# Patient Record
Sex: Female | Born: 1975 | Race: White | Hispanic: No | Marital: Married | Smoking: Current every day smoker
Health system: Southern US, Community
[De-identification: ages and names within clinical notes are randomized; demographics above are authoritative.]

## PROBLEM LIST (undated history)

## (undated) DIAGNOSIS — F329 Major depressive disorder, single episode, unspecified: Secondary | ICD-10-CM

## (undated) DIAGNOSIS — I10 Essential (primary) hypertension: Secondary | ICD-10-CM

## (undated) DIAGNOSIS — F32A Depression, unspecified: Secondary | ICD-10-CM

## (undated) HISTORY — DX: Major depressive disorder, single episode, unspecified: F32.9

## (undated) HISTORY — PX: DILATION AND CURETTAGE OF UTERUS: SHX78

## (undated) HISTORY — DX: Depression, unspecified: F32.A

## (undated) HISTORY — DX: Essential (primary) hypertension: I10

## (undated) HISTORY — PX: TUBAL LIGATION: SHX77

## (undated) HISTORY — PX: OTHER SURGICAL HISTORY: SHX169

---

## 2001-09-01 ENCOUNTER — Other Ambulatory Visit: Admission: RE | Admit: 2001-09-01 | Discharge: 2001-09-01 | Payer: Self-pay | Admitting: Obstetrics and Gynecology

## 2002-04-22 ENCOUNTER — Inpatient Hospital Stay (HOSPITAL_COMMUNITY): Admission: AD | Admit: 2002-04-22 | Discharge: 2002-04-25 | Payer: Self-pay | Admitting: Obstetrics and Gynecology

## 2002-05-17 ENCOUNTER — Emergency Department (HOSPITAL_COMMUNITY): Admission: EM | Admit: 2002-05-17 | Discharge: 2002-05-17 | Payer: Self-pay | Admitting: Emergency Medicine

## 2002-05-17 ENCOUNTER — Encounter: Payer: Self-pay | Admitting: Emergency Medicine

## 2002-10-29 ENCOUNTER — Other Ambulatory Visit: Admission: RE | Admit: 2002-10-29 | Discharge: 2002-10-29 | Payer: Self-pay | Admitting: Obstetrics and Gynecology

## 2004-10-22 ENCOUNTER — Other Ambulatory Visit: Admission: RE | Admit: 2004-10-22 | Discharge: 2004-10-22 | Payer: Self-pay | Admitting: Obstetrics and Gynecology

## 2006-01-24 ENCOUNTER — Inpatient Hospital Stay (HOSPITAL_COMMUNITY): Admission: RE | Admit: 2006-01-24 | Discharge: 2006-01-26 | Payer: Self-pay | Admitting: Obstetrics and Gynecology

## 2006-01-24 ENCOUNTER — Encounter (INDEPENDENT_AMBULATORY_CARE_PROVIDER_SITE_OTHER): Payer: Self-pay | Admitting: Specialist

## 2006-01-29 ENCOUNTER — Observation Stay (HOSPITAL_COMMUNITY): Admission: AD | Admit: 2006-01-29 | Discharge: 2006-01-30 | Payer: Self-pay | Admitting: Obstetrics and Gynecology

## 2006-02-07 ENCOUNTER — Inpatient Hospital Stay (HOSPITAL_COMMUNITY): Admission: AD | Admit: 2006-02-07 | Discharge: 2006-02-08 | Payer: Self-pay | Admitting: Obstetrics and Gynecology

## 2006-02-07 ENCOUNTER — Encounter (INDEPENDENT_AMBULATORY_CARE_PROVIDER_SITE_OTHER): Payer: Self-pay | Admitting: Specialist

## 2008-06-09 ENCOUNTER — Ambulatory Visit: Payer: Self-pay | Admitting: Diagnostic Radiology

## 2008-06-09 ENCOUNTER — Emergency Department (HOSPITAL_BASED_OUTPATIENT_CLINIC_OR_DEPARTMENT_OTHER): Admission: EM | Admit: 2008-06-09 | Discharge: 2008-06-09 | Payer: Self-pay | Admitting: Emergency Medicine

## 2008-09-13 ENCOUNTER — Encounter: Payer: Self-pay | Admitting: Family Medicine

## 2008-09-13 ENCOUNTER — Encounter (INDEPENDENT_AMBULATORY_CARE_PROVIDER_SITE_OTHER): Payer: Self-pay | Admitting: *Deleted

## 2008-09-13 LAB — CONVERTED CEMR LAB
ALT: 18 units/L
AST: 19 units/L
Albumin: 4.4 g/dL
Alkaline Phosphatase: 79 units/L
BUN: 15 mg/dL
CO2, serum: 25 mmol/L
Calcium: 9.6 mg/dL
Chloride, Serum: 103 mmol/L
Creatinine, Ser: 0.93 mg/dL
Glucose, Bld: 77 mg/dL
HCT: 38.2 %
Hemoglobin: 13.1 g/dL
MCH: 34.3 pg
MCV: 86.6 fL
Platelets: 220 10*3/uL
Potassium, serum: 3.9 mmol/L
RBC count: 4.41 10*6/uL
Sodium, serum: 141 mmol/L
Total Bilirubin: 0.5 mg/dL
Total Protein: 6.7 g/dL
WBC, blood: 7.7 10*3/uL

## 2008-09-16 ENCOUNTER — Encounter: Admission: RE | Admit: 2008-09-16 | Discharge: 2008-09-16 | Payer: Self-pay | Admitting: Obstetrics and Gynecology

## 2008-09-16 ENCOUNTER — Encounter: Payer: Self-pay | Admitting: Family Medicine

## 2008-09-20 ENCOUNTER — Encounter: Payer: Self-pay | Admitting: Family Medicine

## 2008-09-21 ENCOUNTER — Ambulatory Visit: Payer: Self-pay | Admitting: Family Medicine

## 2008-09-21 DIAGNOSIS — F329 Major depressive disorder, single episode, unspecified: Secondary | ICD-10-CM

## 2008-09-21 DIAGNOSIS — I10 Essential (primary) hypertension: Secondary | ICD-10-CM

## 2008-09-21 DIAGNOSIS — F419 Anxiety disorder, unspecified: Secondary | ICD-10-CM | POA: Insufficient documentation

## 2008-09-21 DIAGNOSIS — F17200 Nicotine dependence, unspecified, uncomplicated: Secondary | ICD-10-CM

## 2008-09-23 ENCOUNTER — Encounter (INDEPENDENT_AMBULATORY_CARE_PROVIDER_SITE_OTHER): Payer: Self-pay | Admitting: *Deleted

## 2008-10-21 ENCOUNTER — Ambulatory Visit: Payer: Self-pay | Admitting: Family Medicine

## 2008-10-21 DIAGNOSIS — J309 Allergic rhinitis, unspecified: Secondary | ICD-10-CM | POA: Insufficient documentation

## 2008-10-24 ENCOUNTER — Encounter (INDEPENDENT_AMBULATORY_CARE_PROVIDER_SITE_OTHER): Payer: Self-pay | Admitting: *Deleted

## 2008-10-24 LAB — CONVERTED CEMR LAB
ALT: 24 units/L (ref 0–35)
AST: 20 units/L (ref 0–37)
Albumin: 4.2 g/dL (ref 3.5–5.2)
Alkaline Phosphatase: 82 units/L (ref 39–117)
BUN: 19 mg/dL (ref 6–23)
Basophils Absolute: 0 10*3/uL (ref 0.0–0.1)
Basophils Relative: 0.3 % (ref 0.0–3.0)
Bilirubin, Direct: 0.1 mg/dL (ref 0.0–0.3)
CO2: 27 meq/L (ref 19–32)
Calcium: 9.3 mg/dL (ref 8.4–10.5)
Chloride: 104 meq/L (ref 96–112)
Cholesterol: 192 mg/dL (ref 0–200)
Creatinine, Ser: 0.9 mg/dL (ref 0.4–1.2)
Eosinophils Absolute: 0.1 10*3/uL (ref 0.0–0.7)
Eosinophils Relative: 2.2 % (ref 0.0–5.0)
GFR calc non Af Amer: 77.24 mL/min (ref >60)
Glucose, Bld: 81 mg/dL (ref 70–99)
HCT: 40.3 % (ref 36.0–46.0)
HDL: 56.3 mg/dL (ref >39.00)
Hemoglobin: 14.1 g/dL (ref 12.0–15.0)
LDL Cholesterol: 120 mg/dL — ABNORMAL HIGH (ref 0–99)
Lymphocytes Relative: 32.3 % (ref 12.0–46.0)
Lymphs Abs: 2 10*3/uL (ref 0.7–4.0)
MCHC: 35 g/dL (ref 30.0–36.0)
MCV: 87.6 fL (ref 78.0–100.0)
Monocytes Absolute: 0.6 10*3/uL (ref 0.1–1.0)
Monocytes Relative: 9.3 % (ref 3.0–12.0)
Neutro Abs: 3.6 10*3/uL (ref 1.4–7.7)
Neutrophils Relative %: 55.9 % (ref 43.0–77.0)
Platelets: 236 10*3/uL (ref 150.0–400.0)
Potassium: 4.3 meq/L (ref 3.5–5.1)
RBC: 4.6 M/uL (ref 3.87–5.11)
RDW: 11.5 % (ref 11.5–14.6)
Sodium: 139 meq/L (ref 135–145)
TSH: 0.71 microintl units/mL (ref 0.35–5.50)
Total Bilirubin: 0.6 mg/dL (ref 0.3–1.2)
Total CHOL/HDL Ratio: 3
Total Protein: 7.4 g/dL (ref 6.0–8.3)
Triglycerides: 81 mg/dL (ref 0.0–149.0)
VLDL: 16.2 mg/dL (ref 0.0–40.0)
WBC: 6.3 10*3/uL (ref 4.5–10.5)

## 2009-01-10 ENCOUNTER — Telehealth (INDEPENDENT_AMBULATORY_CARE_PROVIDER_SITE_OTHER): Payer: Self-pay | Admitting: *Deleted

## 2009-04-10 ENCOUNTER — Ambulatory Visit: Payer: Self-pay | Admitting: Family

## 2009-05-22 ENCOUNTER — Telehealth (INDEPENDENT_AMBULATORY_CARE_PROVIDER_SITE_OTHER): Payer: Self-pay | Admitting: *Deleted

## 2009-07-17 ENCOUNTER — Telehealth (INDEPENDENT_AMBULATORY_CARE_PROVIDER_SITE_OTHER): Payer: Self-pay | Admitting: *Deleted

## 2009-08-16 ENCOUNTER — Telehealth (INDEPENDENT_AMBULATORY_CARE_PROVIDER_SITE_OTHER): Payer: Self-pay | Admitting: *Deleted

## 2009-09-21 ENCOUNTER — Ambulatory Visit: Payer: Self-pay | Admitting: Family Medicine

## 2009-09-25 LAB — CONVERTED CEMR LAB
AST: 22 units/L (ref 0–37)
Albumin: 4.4 g/dL (ref 3.5–5.2)
Alkaline Phosphatase: 54 units/L (ref 39–117)
BUN: 10 mg/dL (ref 6–23)
Calcium: 9.5 mg/dL (ref 8.4–10.5)
Chloride: 106 meq/L (ref 96–112)
Cholesterol: 158 mg/dL (ref 0–200)
Creatinine, Ser: 0.8 mg/dL (ref 0.4–1.2)
Eosinophils Relative: 1.4 % (ref 0.0–5.0)
GFR calc non Af Amer: 87.98 mL/min (ref >60)
LDL Cholesterol: 91 mg/dL (ref 0–99)
Lymphocytes Relative: 31.8 % (ref 12.0–46.0)
MCV: 89.9 fL (ref 78.0–100.0)
Monocytes Absolute: 0.6 10*3/uL (ref 0.1–1.0)
Neutrophils Relative %: 58.4 % (ref 43.0–77.0)
Platelets: 205 10*3/uL (ref 150.0–400.0)
Total Protein: 7.1 g/dL (ref 6.0–8.3)
Triglycerides: 66 mg/dL (ref 0.0–149.0)
WBC: 7.5 10*3/uL (ref 4.5–10.5)

## 2009-10-03 ENCOUNTER — Telehealth (INDEPENDENT_AMBULATORY_CARE_PROVIDER_SITE_OTHER): Payer: Self-pay | Admitting: *Deleted

## 2009-10-05 ENCOUNTER — Ambulatory Visit: Payer: Self-pay | Admitting: Family Medicine

## 2009-10-05 DIAGNOSIS — L909 Atrophic disorder of skin, unspecified: Secondary | ICD-10-CM | POA: Insufficient documentation

## 2009-10-05 DIAGNOSIS — D239 Other benign neoplasm of skin, unspecified: Secondary | ICD-10-CM | POA: Insufficient documentation

## 2009-10-05 DIAGNOSIS — L919 Hypertrophic disorder of the skin, unspecified: Secondary | ICD-10-CM

## 2009-10-09 ENCOUNTER — Telehealth (INDEPENDENT_AMBULATORY_CARE_PROVIDER_SITE_OTHER): Payer: Self-pay | Admitting: *Deleted

## 2010-02-28 ENCOUNTER — Telehealth: Payer: Self-pay | Admitting: Family Medicine

## 2010-05-14 ENCOUNTER — Telehealth (INDEPENDENT_AMBULATORY_CARE_PROVIDER_SITE_OTHER): Payer: Self-pay | Admitting: *Deleted

## 2010-05-14 ENCOUNTER — Ambulatory Visit: Payer: Self-pay | Admitting: Family Medicine

## 2010-05-14 DIAGNOSIS — M25569 Pain in unspecified knee: Secondary | ICD-10-CM

## 2010-05-23 ENCOUNTER — Ambulatory Visit: Payer: Self-pay | Admitting: Family Medicine

## 2010-05-23 DIAGNOSIS — M2140 Flat foot [pes planus] (acquired), unspecified foot: Secondary | ICD-10-CM | POA: Insufficient documentation

## 2010-05-29 ENCOUNTER — Telehealth: Payer: Self-pay | Admitting: Family Medicine

## 2010-06-13 ENCOUNTER — Telehealth (INDEPENDENT_AMBULATORY_CARE_PROVIDER_SITE_OTHER): Payer: Self-pay | Admitting: *Deleted

## 2010-06-25 ENCOUNTER — Ambulatory Visit
Admission: RE | Admit: 2010-06-25 | Discharge: 2010-06-25 | Payer: Self-pay | Source: Home / Self Care | Attending: Family Medicine | Admitting: Family Medicine

## 2010-06-25 DIAGNOSIS — M7750 Other enthesopathy of unspecified foot: Secondary | ICD-10-CM | POA: Insufficient documentation

## 2010-07-03 NOTE — Progress Notes (Signed)
Summary: amoldipine and lisinopril  Phone Note Refill Request Message from:  [facility] [address] Fax #: [phone]  Refills Requested: Medication #1:  AMLODIPINE BESYLATE 10 MG  TABS once daily   Dosage confirmed as above?Dosage Confirmed   Brand Name Necessary? No   Supply Requested: 1 month   Last Refilled: 07/17/2009  Medication #2:  LISINOPRIL 10 MG  TABS Take 1 tab by mouth daily   Dosage confirmed as above?Dosage Confirmed   Brand Name Necessary? No   Supply Requested: 1 month   Last Refilled: 07/17/2009 Next Appointment Scheduled: none Initial call taken by: Harold Barban,  August 16, 2009 9:38 AM    Prescriptions: LISINOPRIL 10 MG  TABS (LISINOPRIL) Take 1 tab by mouth daily  #30 Tablet x 0   Entered by:   Doristine Devoid   Authorized by:   Neena Rhymes MD   Signed by:   Doristine Devoid on 08/16/2009   Method used:   Electronically to        [facility] [address] [facility] (retail)       [address]       [place], [place]  [address]       Ph: [phone]       Fax: [phone]   RxID:   [id number] AMLODIPINE BESYLATE 10 MG  TABS (AMLODIPINE BESYLATE) once daily  #30 Tablet x 0   Entered by:   Doristine Devoid   Authorized by:   Neena Rhymes MD   Signed by:   Doristine Devoid on 08/16/2009   Method used:   Electronically to        [facility] [address] [facility] (retail)       [address]       [place], [place]  [address]       Ph: [phone]       Fax: [phone]   RxID:   [id number]

## 2010-07-03 NOTE — Miscellaneous (Signed)
Summary: Consent to Procedure/[name]  Consent to Procedure/[name]   Imported By: [name] 10/10/2009 12:20:25  _____________________________________________________________________  External Attachment:    Type:   Image     Comment:   External Document

## 2010-07-03 NOTE — Progress Notes (Signed)
Summary: Pathology Report  Phone Note Outgoing Call   Call placed by: Army Fossa CMA,  Oct 09, 2009 9:43 AM Summary of Call: Pathology Report, LMTCB:  benign Signed by Loreen Freud DO on 10/08/2009 at 12:19 PM  Follow-up for Phone Call        Pt aware...........Marland KitchenFelecia Deloach CMA  Oct 09, 2009 9:59 AM

## 2010-07-03 NOTE — Progress Notes (Signed)
Summary: refill  Phone Note Refill Request Message from:  Fax from Pharmacy on February 28, 2010 4:22 PM  Refills Requested: Medication #1:  FLUTICASONE PROPIONATE 50 MCG/ACT  SUSP 2 sprays each nostril once daily. [facility] [address] - fax [phone]  Initial call taken by: Okey Regal Spring,  February 28, 2010 4:22 PM    Prescriptions: FLUTICASONE PROPIONATE 50 MCG/ACT  SUSP (FLUTICASONE PROPIONATE) 2 sprays each nostril once daily  #1 x 3   Entered by:   Lucious Groves CMA   Authorized by:   Neena Rhymes MD   Signed by:   Lucious Groves CMA on 02/28/2010   Method used:   Electronically to        [facility] [address] [facility] (retail)       [address]       [place], [place]  [address]       Ph: [phone]       Fax: [phone]   RxID:   [id number]

## 2010-07-03 NOTE — Assessment & Plan Note (Signed)
Summary: cpx/kdc   Vital Signs:  Patient profile:   35 year old female Height:      61.50 inches Weight:      147 pounds BMI:     27.42 Pulse rate:   66 / minute BP sitting:   112 / 78  (left arm)  Vitals Entered By: Doristine Devoid (September 21, 2009 8:35 AM) CC: CPX AND LABS   History of Present Illness: 35 yo woman here today for CPE.  no concerns.  Preventive Screening-Counseling & Management  Alcohol-Tobacco     Smoking Status: current     Smoking Cessation Counseling: yes     Smoke Cessation Stage: contemplative     Packs/Day: 0.25  Caffeine-Diet-Exercise     Does Patient Exercise: yes     Type of exercise: gym, running 22 miles/week      Sexual History:  currently monogamous.        Drug Use:  never.    Problems Prior to Update: 1)  Rhinitis  (ICD-477.9) 2)  Healthy Adult Female  (ICD-V70.0) 3)  Tobacco Use  (ICD-305.1) 4)  Hypertension  (ICD-401.9) 5)  Depression  (ICD-311)  Current Medications (verified): 1)  Prozac 40 Mg Caps (Fluoxetine Hcl) .... Take One Tablet Daily 2)  Amlodipine Besylate 10 Mg  Tabs (Amlodipine Besylate) .... Once Daily 3)  Lisinopril 10 Mg  Tabs (Lisinopril) .... Take 1 Tab By Mouth Daily 4)  Fluticasone Propionate 50 Mcg/act  Susp (Fluticasone Propionate) .... 2 Sprays Each Nostril Once Daily  Allergies (verified): 1)  ! 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- husband lost his job husband previously had affair  Family  History: CAD-no HTN-mother,father DM-grandmother,mother STROKE- mother X2 age 12 COLON CA-paternal grandfather dx'd after age 21 BREAST CA-no    Social History: married, 2 children 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    Physical Exam  General:  Well-developed,well-nourished,in no acute distress; alert,appropriate and cooperative throughout examination Head:  Normocephalic and atraumatic without obvious abnormalities. No apparent alopecia or balding. Eyes:  No corneal or conjunctival inflammation noted. EOMI. Perrla. Funduscopic exam benign, without hemorrhages, exudates or papilledema. Vision grossly normal. Ears:  External ear exam shows no significant lesions or deformities.  Otoscopic examination reveals clear canals, tympanic membranes are intact bilaterally without bulging, retraction, inflammation or discharge. Hearing is grossly normal bilaterally. Nose:  External nasal examination shows no deformity or inflammation. Nasal mucosa are pink and moist without lesions or exudates. Mouth:  Oral mucosa and oropharynx without lesions or exudates.  Teeth in good repair. Neck:  No deformities, masses, or tenderness noted. Breasts:  deferred to gyn Lungs:  Normal respiratory effort, chest expands symmetrically. Lungs are clear to auscultation,  no crackles or wheezes. Heart:  Normal rate and regular rhythm. S1 and S2 normal without gallop, murmur, click, rub or other extra  sounds. Abdomen:  Bowel sounds positive,abdomen soft and non-tender without masses, organomegaly or hernias noted. Genitalia:  deferred to GYN Msk:  No deformity or scoliosis noted of thoracic or lumbar spine.   Pulses:  +2 carotid, radial, DP Extremities:  No clubbing, cyanosis, edema, or deformity noted with normal full range of motion of all joints.   Neurologic:  No cranial nerve deficits noted. Station and gait are normal. Plantar reflexes are down-going bilaterally. DTRs are symmetrical throughout. Sensory, motor and coordinative functions appear intact. Skin:  mole on neck not suspicious for cancer but will often get irritated and bleed Cervical Nodes:  No lymphadenopathy noted Psych:  Cognition and judgment appear intact. Alert and cooperative with normal attention span and concentration. No apparent delusions, illusions, hallucinations   Impression & Recommendations:  Problem # 1:  HEALTHY ADULT FEMALE (ICD-V70.0) Assessment Unchanged pt's PE WNL.  labs collected.  encouraged pt to f/u w/ GYN for annual exam.  anticipatory guidance provided. Orders: T-Vitamin D (25-Hydroxy) [id number]) TLB-Lipid Panel (80061-LIPID) TLB-Hepatic/Liver Function Pnl (80076-HEPATIC) TLB-TSH (Thyroid Stimulating Hormone) (84443-TSH)  Problem # 2:  TOBACCO USE (ICD-305.1) Assessment: Unchanged  reviewed importance of quitting.  discussed options.  pt to think about it The following medications were removed from the medication list:    Chantix Starting Month Pak 0.5 Mg X 11 & 1 Mg X 42 Misc (Varenicline tartrate) .Marland Kitchen... 0.5mg  by mouth once daily for 3 days, then twice daily for 4 days and then 1mg  by mouth 2 times daily    Chantix 1 Mg Tabs (Varenicline tartrate) .Marland Kitchen... Take one tablet 2 times daily  Orders: Tobacco use cessation intermediate 3-10 minutes (42706)  Problem # 3:  HYPERTENSION (ICD-401.9) Assessment: Unchanged well controlled.  asymptomatic.  cont meds Her updated medication list  for this problem includes:    Amlodipine Besylate 10 Mg Tabs (Amlodipine besylate) ..... Once daily    Lisinopril 10 Mg Tabs (Lisinopril) .Marland Kitchen... Take 1 tab by mouth daily  Orders: Venipuncture (23[address]) TLB-BMP (Basic Metabolic Panel-BMET) (80048-METABOL) TLB-CBC Platelet - w/Differential (85025-CBCD)  Complete Medication List: 1)  Prozac 40 Mg Caps (Fluoxetine hcl) .... Take one tablet daily 2)  Amlodipine Besylate 10 Mg Tabs (Amlodipine besylate) .... Once daily 3)  Lisinopril 10 Mg Tabs (Lisinopril) .... Take 1 tab by mouth daily 4)  Fluticasone Propionate 50 Mcg/act Susp (Fluticasone propionate) .... 2 sprays each nostril once daily  Patient Instructions: 1)  Please schedule an appt with Dr Laury Axon to have your mole removed 2)  Call with any questions or or concerns 3)  Your blood pressure looks great!  Keep up the good work! 4)  Try and quit smoking- you've done it before! 5)  Hang in there!!! Prescriptions: PROZAC 40 MG CAPS (FLUOXETINE HCL) take one tablet daily  #30 x 6   Entered and Authorized by:   Neena Rhymes MD   Signed by:   Neena Rhymes MD on 09/21/2009   Method used:   Electronically to        [facility] [address] (retail)       [address]       [place], [place]  [address]       Ph: [phone]       Fax: [phone]   RxID:   [id number] FLUTICASONE PROPIONATE 50 MCG/ACT  SUSP (  FLUTICASONE PROPIONATE) 2 sprays each nostril once daily  #1 x 3   Entered and Authorized by:   Neena Rhymes MD   Signed by:   Neena Rhymes MD on 09/21/2009   Method used:   Electronically to        [facility] (retail)       [address]       [place], [place]  [address]       Ph: [phone]       Fax: [phone]   RxID:   [id number] LISINOPRIL 10 MG  TABS (LISINOPRIL) Take 1 tab by mouth daily  #30 x 6   Entered and Authorized by:   Neena Rhymes MD   Signed by:   Neena Rhymes  MD on 09/21/2009   Method used:   Electronically to        [facility] (retail)       [address]       [place], [place]  [address]       Ph: [phone]       Fax: [phone]   RxID:   [id number] AMLODIPINE BESYLATE 10 MG  TABS (AMLODIPINE BESYLATE) once daily  #30 x 6   Entered and Authorized by:   Neena Rhymes MD   Signed by:   Neena Rhymes MD on 09/21/2009   Method used:   Electronically to        [facility] (retail)       [address]       [place], [place]  [address]       Ph: [phone]       Fax: [phone]   RxID:   [id number]

## 2010-07-03 NOTE — Progress Notes (Signed)
Summary: Refill Request  Phone Note Refill Request Message from:  [facility] [address] Fax # [phone]  Refills Requested: Medication #1:  LISINOPRIL 10 MG  TABS Take 1 tab by mouth daily   Dosage confirmed as above?Dosage Confirmed   Brand Name Necessary? No   Last Refilled: 06/23/2009  Medication #2:  AMLODIPINE BESYLATE 10 MG  TABS once daily   Brand Name Necessary? No   Last Refilled: 06/23/2009 Initial call taken by: Harold Barban,  July 17, 2009 11:07 AM    Prescriptions: LISINOPRIL 10 MG  TABS (LISINOPRIL) Take 1 tab by mouth daily  #30 Tablet x 0   Entered by:   Kandice Hams   Authorized by:   Neena Rhymes MD   Signed by:   Kandice Hams on 07/17/2009   Method used:   Faxed to ...       [facility] [address] [facility] (retail)       [address]       [place], [place]  [address]       Ph: [phone]       Fax: [phone]   RxID:   [id number] AMLODIPINE BESYLATE 10 MG  TABS (AMLODIPINE BESYLATE) once daily  #30 Tablet x 0   Entered by:   Kandice Hams   Authorized by:   Neena Rhymes MD   Signed by:   Kandice Hams on 07/17/2009   Method used:   Faxed to ...       [facility] (retail)       [address]       [place], [place]  [address]       Ph: [phone]       Fax: [phone]   RxID:   [id number]

## 2010-07-03 NOTE — Assessment & Plan Note (Signed)
Summary: FOR A MOLE REMOVER--PH   Vital Signs:  Patient profile:   35 year old female Height:      61.5 inches Weight:      145 pounds Temp:     98.3 degrees F oral Pulse rate:   66 / minute Pulse rhythm:   regular  Vitals Entered By: Army Fossa CMA (Oct 05, 2009 9:56 AM) CC: Pt here for skig tag removal x 3 neck and under arm.    History of Present Illness: Pt her for mole and skin tag removal.    Allergies: 1)  ! Morphine   Impression & Recommendations:  Problem # 1:  SKIN TAG (ICD-701.9)  Orders: Removal of Skin Tags up to 15 Lesions (11200) Shave Skin Lesion 0.6-1.0 cm/trunk/arm/leg (11301)  Problem # 2:  BENIGN NEOPLASM OF SKIN SITE UNSPECIFIED (ICD-216.9)  Orders: Removal of Skin Tags up to 15 Lesions (11200) Shave Skin Lesion 0.6-1.0 cm/trunk/arm/leg (11301)  Complete Medication List: 1)  Prozac 40 Mg Caps (Fluoxetine hcl) .... Take one tablet daily 2)  Amlodipine Besylate 10 Mg Tabs (Amlodipine besylate) .... Once daily 3)  Lisinopril 10 Mg Tabs (Lisinopril) .... Take 1 tab by mouth daily 4)  Fluticasone Propionate 50 Mcg/act Susp (Fluticasone propionate) .... 2 sprays each nostril once daily  Other Orders: Venipuncture (11914) TLB-TSH (Thyroid Stimulating Hormone) (84443-TSH) TLB-T3, Free (Triiodothyronine) (84481-T3FREE) TLB-T4 (Thyrox), Free [id number])  Procedure Note  Mole Biopsy/Removal: The patient complains of changing mole but denies pain, redness, irritation, inflammation, tenderness, swelling, foreign body, suspicious lesion, changing lesion, discharge, and fever. Onset of lesion: > 3 months Indication: changing lesion Consent signed: yes  Procedure # 1: shave biopsy    Size (in cm): 1.1 x 1.0    Region: posterior    Location: neck-posterior    Comment: Pt states its been there for years but hasrecently changed bleeding stopped with silver nitrate    Instrument used: #15 blade    Anesthesia: 1% lidocaine w/epinephrine  Cleaned  and prepped with: alcohol and betadine Wound dressing: neosporin and bandaid Instructions: daily dressing changes Additional Instructions: keep dry 24 hours  Skin Tag Removal: The patient complains of irritation and inflammation. Date of onset: 10/08/2006 Onset of lesion: > 3 months Indication: inflamed lesion Consent signed: yes  Procedure # 1: skin tag removal    Region: lateral    Location: right axilla    # lesions removed: 1    Instrument used: scissors    Anesthesia: 1% lidocaine w/epinephrine  Procedure # 2: skin tag removal    Region: lateral    Location: R side neck    # lesions removed: 1    Instrument used: scissors    Anesthesia: none  Cleaned and prepped with: alcohol and betadine Wound dressing: neosporin and bandaid Instructions: daily dressing changes Additional Instructions: keep dry 24 hours

## 2010-07-03 NOTE — Progress Notes (Signed)
Summary: lab appt needed-lmom  Phone Note Outgoing Call   Call placed by: Doristine Devoid,  Oct 03, 2009 1:25 PM Call placed to: Patient Summary of Call: need to schedule lab to repeat tsh,free T3,free T4  left message on machine ........Marland KitchenDoristine Devoid  Oct 03, 2009 1:26 PM   Follow-up for Phone Call        pt called back informed of labs and lab scheduled for free t3/t4  per Dr Beverely Low request .Kandice Hams  Oct 03, 2009 2:55 PM  Follow-up by: Kandice Hams,  Oct 03, 2009 2:55 PM

## 2010-07-05 NOTE — Progress Notes (Signed)
Summary: lisinopril, amlodipine   refills  Phone Note Refill Request Message from:  Fax from Pharmacy on May 14, 2010 10:02 AM  Refills Requested: Medication #1:  LISINOPRIL 10 MG  TABS Take 1 tab by mouth daily   Last Refilled: 04/13/2010  Medication #2:  AMLODIPINE BESYLATE 10 MG  TABS once daily   Last Refilled: 04/13/2010 [facility] [address]   phone--[phone]  fax - [phone]   qty - 30  Next Appointment Scheduled: today--12/12 Initial call taken by: Jerolyn Shin,  May 14, 2010 10:03 AM  Follow-up for Phone Call        spoke w/ patient prescription [facility] [address] [facility] [place] at patient request...Marland KitchenMarland KitchenDoristine Devoid CMA  May 14, 2010 2:17 PM     Prescriptions: LISINOPRIL 10 MG  TABS (LISINOPRIL) Take 1 tab by mouth daily  #30 x 6   Entered by:   Doristine Devoid CMA   Authorized by:   Neena Rhymes MD   Signed by:   Doristine Devoid CMA on 05/14/2010   Method used:   [facility]ron[facility]        [facility]       [address]       [place], [place]  [address]       Ph: [phone]       Fax: [phone]   RxID:   [id number] AMLODIPINE BESYLATE 10 MG  TABS (AMLODIPINE BESYLATE) once daily  #30 x 6   Entered by:   Doristine Devoid CMA   Authorized by:   Neena Rhymes MD   Signed by:   Doristine Devoid CMA on 05/14/2010   Method used:   [facility]ronically to        [facility]       [address]       [place], [place]  [address]       Ph: [phone]       Fax: [phone]   RxID:   [id number]

## 2010-07-05 NOTE — Progress Notes (Signed)
Summary: Amlodipine, Lisinopril refills ([facility])  Phone Note Refill Request Message from:  Fax from Pharmacy on June 13, 2010 12:44 PM  Refills Requested: Medication #1:  AMLODIPINE BESYLATE 10 MG  TABS once daily   Last Refilled: 04/13/2010  Medication #2:  LISINOPRIL 10 MG  TABS Take 1 tab by mouth daily [id number], [address] [place], [place]  phone- [phone], fax [phone]   qty = 30     (earlier prescriptions for these medications  [facility] in [place])  Next Appointment Scheduled: none Initial call taken by: Jerolyn Shin,  June 13, 2010 12:45 PM    Prescriptions: LISINOPRIL 10 MG  TABS (LISINOPRIL) Take 1 tab by mouth daily  #30 x 6   Entered by:   Doristine Devoid CMA   Authorized by:   Neena Rhymes MD   Signed by:   Doristine Devoid CMA on 06/13/2010   Method used:   Electronically to        [facility] ([facility]ail)       [address]       [place], [place]  [address]       Ph: [phone]       Fax: [phone]   RxID:   [id number] AMLODIPINE BESYLATE 10 MG  TABS (AMLODIPINE BESYLATE) once daily  #30 x 6   Entered by:   Doristine Devoid CMA   Authorized by:   Neena Rhymes MD   Signed by:   Doristine Devoid CMA on 06/13/2010   Method used:   Electronically to        [facility] ([facility]ail)       [address]       [place], [place]  [address]       Ph: [phone]       Fax: [phone]   RxID:   [id number]

## 2010-07-05 NOTE — Assessment & Plan Note (Signed)
Summary: REFER FRM DR. TABORI FOR LEFT KNEE PAIN / LFW   Vital Signs:  Patient profile:   35 year old female Height:      61.5 inches Weight:      152 pounds BMI:     28.36 Temp:     98.4 degrees F oral Pulse rate:   72 / minute Pulse rhythm:   regular BP sitting:   120 / 88  (left arm) Cuff size:   regular  Vitals Entered By: Linde Gillis CMA Duncan Dull) (May 23, 2010 1:47 PM) CC: left knee pain   History of Present Illness: Dr. 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 Since then, she has not been exercising. She recently wanted to try running, but could not.  Pain in the anterior of the knee, pain with standing, pain with going down stairs. No symptomatic giving way or locking up of her joint. No falls or trauma.  No history of surgery.  No bracing.  Allergies: 1)  ! Morphine  Past History:  Past medical, surgical, family and social histories (including risk factors) reviewed, and no changes noted (except as noted below).  Past Medical History: Reviewed history from 05/14/2010 and no changes required. Depression Hypertension tobacco abuse  Past Surgical History: Reviewed history from 09/21/2008 and no changes required. Caesarean section x2 Dilation and currettage Tubal ligation  Family History: Reviewed history from 09/21/2009 and no changes required. CAD-no HTN-mother,father DM-grandmother,mother STROKE- mother X2 age 48 COLON CA-paternal grandfather dx'd after age 78 BREAST CA-no    Social History: Reviewed history from 09/21/2009 and no changes required. 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 Otherwise the pertinent positives of the ROS are noted above.    Physical Exam  General:  GEN: Well-developed,well-nourished,in no acute distress; alert,appropriate and cooperative throughout examination HEENT: Normocephalic and atraumatic without obvious abnormalities. No apparent alopecia or balding. Ears, externally no deformities PULM: Breathing comfortably in no respiratory distress EXT: No clubbing, cyanosis, or edema PSYCH: Normally interactive. Cooperative during the interview. Pleasant. Friendly and conversant. Not anxious or depressed appearing. Normal, full affect.  Msk:  R knee, full ROM, Str 5/5, stable to varus and valgus stress, lachman and drawers with increased laxity but firm endpoint.  L knee: moderate effusion.  Medial and lateral patellar facets TTP +superior and inferior polar grind tests neg patellar apprehension test  stable MCL and LCL on exam Again, increased laxity on anterior drawer and lachman compared to standard, but equal to contralateral side with firm endpoint.  bounce home, flexion pinch, mcmurrays all negative.  no significant plica.  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  R leg is 1 cm shorter, severe pronator on gate, all of which will mechanically make  L knee track more outward in the groove.  Natural cavus longitudinal arch, but she has greater plasticity on multiple joints, which also contributes.  Inject knee, calm down, ice this week, begin quad str, then in 1 month, I will make orthotics with leg length  correction.  cc: Dr. Beverely Low. I appreciate the opportunity to care for this very pleasant young patient.  Knee Injection Patient verbally consented to procedure. Risks, benefits, and alternatives explained. Sterilely prepped with betadine. Ethyl cholride used for anesthesia. 9 cc Lidocaine 1% mixed with 1 cc of Kenalog 40 mg injected using the anterolateral approach without difficulty. No complications with procedure and tolerated well. Patient had decreased pain post-injection.   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  Complete Medication List: 1)  Prozac 40 Mg Caps (Fluoxetine hcl) .... Take one tablet daily 2)  Amlodipine Besylate 10 Mg Tabs (Amlodipine besylate) .... Once daily 3)  Lisinopril 10 Mg Tabs (Lisinopril) .... Take 1 tab by mouth daily 4)  Fluticasone Propionate 50 Mcg/act Susp (Fluticasone propionate) .... 2 sprays each nostril once daily 5)  Chantix Starting Month Pak 0.5 Mg X 11 & 1 Mg X 42 Misc (Varenicline tartrate) .... 0.5mg  by mouth once daily for 3 days, then twice daily for 4 days and then 1mg  by mouth 2 times daily 6)  Chantix 1 Mg Tabs (Varenicline tartrate) .Marland Kitchen.. 1 tab by mouth 2 times daily  Patient Instructions: 1)  f/u Dr. Patsy Lager 1 month 2)  (45 minute appt at 8 AM or 2 PM for orthotics)   Orders Added: 1)  T-DG Knee Bilateral Standing AP [73565] 2)  T-Knee Left 2 view [73560TC] 3)   Consultation Level III [11914] 4)  Joint Aspirate / Injection, Large [20610] 5)  Kenalog 10mg  (4units) [J3301]    Current Allergies (reviewed today): ! MORPHINE

## 2010-07-05 NOTE — Assessment & Plan Note (Signed)
Summary: ORTHOTICS PER DR Birtha Hatler/DLO   Vital Signs:  Patient profile:   35 year old female Weight:      156.50 pounds Temp:     98.3 degrees F oral Pulse rate:   76 / minute Pulse rhythm:   regular BP sitting:   114 / 78  (left arm) Cuff size:   regular  Vitals Entered By: Sydell Axon LPN (June 25, 2010 1:57 PM) CC: Orthotics   History of Present Illness: 35 year old female with mild OA and PFS who presents in f/u left knee pain. Also with signficant natural cavus foot, with some hypermobility on all joints, and excessive pronation on gait. Also with a 1 cm LL discrepancy, R leg shorter than the left. s/p intraarticular injection, knee has been feeling good, ran a couple of times up to 1 1/2 miles.    05/24/2011 OV: Dr. 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 Since then, she has not been exercising. She recently wanted to try running, but could not.  Pain in the anterior of the knee, pain with standing, pain with going down stairs. No symptomatic giving way or locking up of her joint. No falls or trauma.  No history of surgery.  No bracing.  Allergies: 1)  ! Morphine  Past History:  Past medical, surgical, family and social histories (including risk factors) reviewed, and no changes noted (except as noted below).  Past Medical History: Reviewed history from 05/14/2010 and no changes required. Depression Hypertension tobacco abuse  Past Surgical History: Reviewed history from 09/21/2008 and no changes required. Caesarean section x2 Dilation and currettage Tubal ligation  Family History: Reviewed history from 09/21/2009 and no changes required. CAD-no HTN-mother,father DM-grandmother,mother STROKE- mother X2 age 64 COLON CA-paternal grandfather dx'd after age 25 BREAST CA-no     Social History: Reviewed history from 09/21/2009 and no changes required. 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 Otherwise the pertinent positives of the ROS are noted above.    Physical Exam  General:  GEN: Well-developed,well-nourished,in no acute distress; alert,appropriate and cooperative throughout examination HEENT: Normocephalic and atraumatic without obvious abnormalities. No apparent alopecia or balding. Ears, externally no deformities PULM: Breathing comfortably in no respiratory distress EXT: No clubbing, cyanosis, or edema PSYCH: Normally interactive. Cooperative during the interview. Pleasant. Friendly and conversant. Not anxious or depressed appearing. Normal, full affect.  Msk:  R knee, full ROM, Str 5/5, stable to varus and valgus stress, lachman and drawers with increased laxity but firm endpoint.  L knee: no effusion.  Medial and lateral patellar facets TTP +superior and inferior polar grind tests neg patellar apprehension test  stable MCL and LCL on exam Again, increased laxity on anterior drawer and lachman compared to standard, but equal to contralateral side with firm endpoint.  bounce home, flexion pinch, mcmurrays all negative.  no significant plica.  LL: R leg length 1 cm shorter compared to L  Str 5/5 hip flexion and abduction  Feet: natural cavus  foot midfoot and forefoot breakdown with no hindfoot breakdown   Impression & Recommendations:  Problem # 1:  PATELLO-FEMORAL SYNDROME (ICD-719.46)  Patient was fitted for a standard, cushioned, semi-rigid orthotic.  The orthotic was heated and the patient  stood on the orthotic blank positioned on the orthotic stand. The patient was positioned in subtalar neutral position and 10 degrees of ankle dorsiflexion in a weight bearing stance. After molding, a stable Fast-Tech EVA base was applied to the orthotic blank.   The blank was ground to a stable position for weight bearing. size: 8 base: F2 black posting: none additional orthotic padding: none  approx 5 mm lift placed on R heel  comfortable in the office.  reviewed PFS rehab program with the patient 3 times a week. hip and quad strengthening and multiplanar movements. OK to progress running, increasing 10% a week, slower if needed.  Her updated medication list for this problem includes:    Diclofenac Sodium 75 Mg Tbec (Diclofenac sodium) .Marland Kitchen... Take one tablet by mouth twice daily  Orders: [employer], each unit [phone])  Problem # 2:  KNEE PAIN (ICD-719.46)  Her updated medication list for this problem includes:    Diclofenac Sodium 75 Mg Tbec (Diclofenac sodium) .Marland Kitchen... Take one tablet by mouth twice daily  Orders: [employer], each unit (H6073)  Problem # 3:  PES PLANUS (ICD-734)  Orders: [employer], each unit (X1062)  Problem # 4:  METATARSALGIA (ICD-726.70)  Orders: [employer], each unit ([id number])  Complete Medication List: 1)  Prozac 40 Mg Caps (Fluoxetine hcl) .... Take one tablet daily 2)  Amlodipine Besylate 10 Mg Tabs (Amlodipine besylate) .... Once daily 3)  Lisinopril 10 Mg Tabs (Lisinopril) .... Take 1 tab by mouth daily 4)  Fluticasone Propionate 50 Mcg/act Susp (Fluticasone propionate) .... 2 sprays each nostril once daily 5)  Chantix Starting Month Pak 0.5 Mg X 11 & 1 Mg X 42 Misc (Varenicline tartrate) .... 0.5mg  by mouth once daily for 3 days, then twice daily for 4 days and then 1mg  by mouth 2 times daily 6)  Chantix 1 Mg Tabs (Varenicline tartrate) .Marland Kitchen.. 1 tab by mouth 2 times daily 7)  Diclofenac Sodium 75 Mg Tbec (Diclofenac  sodium) .... Take one tablet by mouth twice daily   Orders Added: 1)  Est. Patient Level IV [69485] 2)  Or[employer]otic [employer], each unit [L3002]    Current Allergies (reviewed today): ! MORPHINE

## 2010-07-05 NOTE — Progress Notes (Signed)
Summary: ? medication  Phone Note Call from Patient   Caller: Patient Call For: Spencer Copland Summary of Call: Patient came in today said that at last office you were going to call in an anti inflamatory but, it wasnt called in. [facility] [place]  please advise.Consuello Masse CMA   Initial call taken by: Benny Lennert CMA Duncan Dull),  May 29, 2010 4:11 PM  Follow-up for Phone Call        I apologize, I meant to fax in Voltaren  Send in  Diclofenac 75 mg, 1 by mouth two times a day, #60, 2 refills  Apologize that I not do this initially. It was a mistake, and I forgot to fax it in to pharmacy. Follow-up by: Hannah Beat MD,  May 29, 2010 4:17 PM    New/Updated Medications: DICLOFENAC SODIUM 75 MG TBEC (DICLOFENAC SODIUM) take one tablet by mouth twice daily Prescriptions: DICLOFENAC SODIUM 75 MG TBEC (DICLOFENAC SODIUM) take one tablet by mouth twice daily  #60 x 0   Entered by:   Benny Lennert CMA (AAMA)   Authorized by:   Hannah Beat MD   Signed by:   Benny Lennert CMA (AAMA) on 05/29/2010   Method used:   Electronically to        [facility]       [address]       [place], [place]  [address]       Ph: [phone]       Fax: [phone]   RxID:   [id number]

## 2010-07-05 NOTE — Assessment & Plan Note (Signed)
Summary: FOLLOW UP/RH...............[facility]land [facility]   Vital Signs:  Patient profile:   35 year old female Weight:      152 pounds BMI:     28.36 Pulse rate:   118 / minute BP sitting:   114 / 82  (left arm)  Vitals Entered By: Doristine Devoid CMA (May 14, 2010 2:16 PM) CC: 6 month bp f/u    History of Present Illness: 35 yo woman here today for 6 mo f/u on BP.  no CP, SOB, HAs, visual changes, edema.  excellent control today.  L knee pain- sxs started 2-3 months ago.  intermittant.  will stiffen after sitting for prolonged periods.  upon standing pt will be unable to bear weight and feels that it is 'giving out' on her.  taking left over hydrocodone w/ some relief.  tobacco use- son wants her to quit smoking for christmas.  would like to try chantix again.  worked well previously but 'as soon as i stop taking it i smoke again'.  Preventive Screening-Counseling & Management  Alcohol-Tobacco     Smoking Status: current     Smoking Cessation Counseling: yes     Smoke Cessation Stage: ready  Caffeine-Diet-Exercise     Does Patient Exercise: yes  Current Medications (verified): 1)  Prozac 40 Mg Caps (Fluoxetine Hcl) .... Take One Tablet Daily 2)  Amlodipine Besylate 10 Mg  Tabs (Amlodipine Besylate) .... Once Daily 3)  Lisinopril 10 Mg  Tabs (Lisinopril) .... Take 1 Tab By Mouth Daily 4)  Fluticasone Propionate 50 Mcg/act  Susp (Fluticasone Propionate) .... 2 Sprays Each Nostril Once Daily  Allergies (verified): 1)  ! Morphine  Past History:  Past medical, surgical, family and social histories (including risk factors) reviewed, and no changes noted (except as noted below).  Past Medical History: Depression Hypertension tobacco abuse  Past Surgical History: Reviewed history from 09/21/2008 and no changes required. Caesarean section x2 Dilation and currettage Tubal ligation  Family History: Reviewed history from 09/21/2009 and no changes  required. CAD-no HTN-mother,father DM-grandmother,mother STROKE- mother X2 age 55 COLON CA-paternal grandfather dx'd after age 82 BREAST CA-no    Social History: Reviewed history from 09/21/2009 and no changes required. 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 No apparent alopecia or balding. Neck:  No deformities, masses, or tenderness noted. Lungs:  Normal respiratory effort, chest expands symmetrically. Lungs are clear to auscultation, no crackles or wheezes. Heart:  Normal rate and regular rhythm. S1 and S2 normal without gallop, murmur, click, rub or other extra sounds. Msk:  + TTP along joint line of L knee no pain over patella good flexion and extension- no crepitus heard no pain w/ internal or external rotation Pulses:  +2 carotid, radial, DP Extremities:  No clubbing, cyanosis, edema, or deformity noted with normal full range of motion of all joints.   Psych:  Cognition and judgment appear intact. Alert and cooperative with normal attention span and concentration. No apparent delusions, illusions, hallucinations   Impression & Recommendations:  Problem # 1:  HYPERTENSION (ICD-401.9) Assessment Unchanged BP remains well controlled.  asymptomatic.  no changes at this time. Her updated medication list for this problem includes:    Amlodipine Besylate 10 Mg Tabs (Amlodipine  besylate) ..... Once daily    Lisinopril 10 Mg Tabs (Lisinopril) .Marland Kitchen... Take 1 tab by mouth daily  Problem # 2:  KNEE PAIN ([facility]-932.35) Assessment: New refer to sports med as pt would like to resume exercising asap. Orders: Sports Medicine (Sports Med)  Problem # 3:  TOBACCO USE  (ICD-305.1) Assessment: Unchanged retry Chantix.  pt says she is committed this time. Her updated medication list for this problem includes:    Chantix Starting Month Pak 0.5 Mg X 11 & 1 Mg X 42 Misc (Varenicline tartrate) .Marland Kitchen... 0.5mg  by mouth once daily for 3 days, then twice daily for 4 days and then 1mg  by mouth 2 times daily    Chantix 1 Mg Tabs (Varenicline tartrate) .Marland Kitchen... 1 tab by mouth 2 times daily  Complete Medication List: 1)  Prozac 40 Mg Caps (Fluoxetine hcl) .... Take one tablet daily 2)  Amlodipine Besylate 10 Mg Tabs (Amlodipine besylate) .... Once daily 3)  Lisinopril 10 Mg Tabs (Lisinopril) .... Take 1 tab by mouth daily 4)  Fluticasone Propionate 50 Mcg/act Susp (Fluticasone propionate) .... 2 sprays each nostril once daily 5)  Chantix Starting Month Pak 0.5 Mg X 11 & 1 Mg X 42 Misc (Varenicline tartrate) .... 0.5mg  by mouth once daily for 3 days, then twice daily for 4 days and then 1mg  by mouth 2 times daily 6)  Chantix 1 Mg Tabs (Varenicline tartrate) .Marland Kitchen.. 1 tab by mouth 2 times daily  Patient Instructions: 1)  Schedule your complete physical for after 4/21 2)  Your blood pressure looks great!  Keep up the good work! 3)  Start the Chantix as directed- use the starting pack first and then the continuation pack. 4)  Someone will call you with your sports med appt for your knee 5)  Good luck on quitting smoking- you can do it!!! 6)  Happy Holidays!! Prescriptions: PROZAC 40 MG CAPS (FLUOXETINE HCL) take one tablet daily  #30 x 6   Entered and Authorized by:   Neena Rhymes MD   Signed by:   Neena Rhymes MD on 05/14/2010   Method used:   Electronically to        [facility] ([facility])       [address]       [place], [place]  [address]       Ph: [phone]       Fax: [phone]   RxID:   [id number] FLUTICASONE PROPIONATE 50 MCG/ACT  SUSP (FLUTICASONE PROPIONATE) 2 sprays each nostril once daily  #1 x 3    Entered and Authorized by:   Neena Rhymes MD   Signed by:   Neena Rhymes MD on 05/14/2010   Method used:   Electronically to        [facility] ([facility])       [address]       [place], [place]  [address]       Ph: [phone]       Fax: [phone]   RxID:   [id number] LISINOPRIL 10 MG  TABS (LISINOPRIL) Take 1 tab by mouth daily  #30 x 6   Entered and Authorized by:   Neena Rhymes MD   Signed by:   Neena Rhymes MD on 05/14/2010   Method used:   Electronically to        [facility] ([facility]  [address]       [place], [place]  [address]       Ph: [phone]       Fax: [phone]   RxID:   [id number] AMLODIPINE BESYLATE 10 MG  TABS (AMLODIPINE BESYLATE) once daily  #30 x 6   Entered and Authorized by:   Neena Rhymes MD   Signed by:   Neena Rhymes MD on 05/14/2010   Method used:   Electronically to        [facility] ([facility]       [address]       [place], [place]  [address]       Ph: [phone]       Fax: [phone]   RxID:   [id number] CHANTIX 1 MG TABS (VARENICLINE TARTRATE) 1 tab by mouth 2 times daily  #60 x 3   Entered and Authorized by:   Neena Rhymes MD   Signed by:   Neena Rhymes MD on 05/14/2010   Method used:   Electronically to        [facility]       [address]       [place], [place]  [address]       Ph: [phone]       Fax: [phone]   RxID:   [id number] CHANTIX STARTING MONTH PAK 0.5 MG X 11 & 1 MG X 42  MISC (VARENICLINE TARTRATE) 0.5mg  by mouth once daily for 3 days, then twice daily for 4 days and then 1mg  by mouth 2 times daily  #1 pack x 0   Entered and Authorized by:   Neena Rhymes MD   Signed by:   Neena Rhymes MD on 05/14/2010   Method used:   Electronically to        [facility]       [address]       [place], [place]  [address]       Ph: [phone]       Fax: [phone]   RxID:   [id number]    Orders Added: 1)  Sports Medicine [Sports Med] 2)  Est. Patient Level IV [77824]

## 2010-07-06 ENCOUNTER — Other Ambulatory Visit: Payer: Self-pay | Admitting: Obstetrics and Gynecology

## 2010-09-17 LAB — BASIC METABOLIC PANEL
CO2: 29 mEq/L (ref 19–32)
Glucose, Bld: 87 mg/dL (ref 70–99)
Potassium: 3.3 mEq/L — ABNORMAL LOW (ref 3.5–5.1)
Sodium: 138 mEq/L (ref 135–145)

## 2010-10-04 ENCOUNTER — Encounter: Payer: Self-pay | Admitting: Family Medicine

## 2010-10-04 ENCOUNTER — Encounter: Payer: Self-pay | Admitting: *Deleted

## 2010-10-17 ENCOUNTER — Ambulatory Visit (INDEPENDENT_AMBULATORY_CARE_PROVIDER_SITE_OTHER): Payer: 59 | Admitting: Family Medicine

## 2010-10-17 ENCOUNTER — Encounter: Payer: Self-pay | Admitting: Family Medicine

## 2010-10-17 DIAGNOSIS — H9209 Otalgia, unspecified ear: Secondary | ICD-10-CM

## 2010-10-17 DIAGNOSIS — I10 Essential (primary) hypertension: Secondary | ICD-10-CM

## 2010-10-17 DIAGNOSIS — F329 Major depressive disorder, single episode, unspecified: Secondary | ICD-10-CM

## 2010-10-17 DIAGNOSIS — F3289 Other specified depressive episodes: Secondary | ICD-10-CM

## 2010-10-17 DIAGNOSIS — Z0000 Encounter for general adult medical examination without abnormal findings: Secondary | ICD-10-CM

## 2010-10-17 DIAGNOSIS — R3915 Urgency of urination: Secondary | ICD-10-CM

## 2010-10-17 MED ORDER — FLUOXETINE HCL 20 MG PO CAPS: 20.0000 mg | ORAL_CAPSULE | Freq: Every day | ORAL | Status: DC

## 2010-10-17 MED ORDER — SOLIFENACIN SUCCINATE 5 MG PO TABS: 5.0000 mg | ORAL_TABLET | Freq: Every day | ORAL | Status: DC

## 2010-10-17 NOTE — Patient Instructions (Signed)
Schedule a lab visit at your convenience Follow up w/ me in 6 months to recheck BP Start the Vesicare daily and let me know if symptoms improve after 1 month STOP the Prozac 40mg  START the Prozac 20mg  daily x1-2 months Call when you are running low on the 20mg  and we'll send in the 10 mg tabs Call with any questions or concerns Happy Memorial Day!!!

## 2010-10-17 NOTE — Progress Notes (Signed)
  Subjective:    Patient ID: [name], female    DOB: January 26, 1976, 35 y.o.   MRN: [id number]  HPI CPE- UTD on pap w/ Dr Henderson Cloud.  L ear pain- sxs started [place] the [place].  No drainage.  No fevers but did have night sweat last night.  Urinary urgency- sxs started 2 months ago.  Will go from 'i sorta need to use the bathroom to needing to pee my pants in 5 minutes'.  Review of Systems ROS Patient reports no vision/ hearing changes, adenopathy,fever, weight change,  persistant/recurrent hoarseness , swallowing issues, chest pain, palpitations, edema, persistant/recurrent cough, hemoptysis, dyspnea (rest/exertional/paroxysmal nocturnal), gastrointestinal bleeding (melena, rectal bleeding), abdominal pain, significant heartburn, bowel changes, Gyn symptoms (abnormal  bleeding, pain),  syncope, focal weakness, memory loss, numbness & tingling, skin/hair/nail changes, abnormal bruising or bleeding, anxiety, or depression.     Objective:   Physical Exam  General Appearance:    Alert, cooperative, no distress, appears stated age  Head:    Normocephalic, without obvious abnormality, atraumatic  Eyes:    PERRL, conjunctiva/corneas clear, EOM's intact, fundi    benign, both eyes  Ears:    Normal TM's and external ear canals, both ears  Nose:   Nares normal, septum midline, mucosa normal, no drainage    or sinus tenderness  Throat:   Lips, mucosa, and tongue normal; teeth and gums normal  Neck:   Supple, symmetrical, trachea midline, no adenopathy;    Thyroid: no enlargement/tenderness/nodules  Back:     Symmetric, no curvature, ROM normal, no CVA tenderness  Lungs:     Clear to auscultation bilaterally, respirations unlabored  Chest Wall:    No tenderness or deformity   Heart:    Regular rate and rhythm, S1 and S2 normal, no murmur, rub   or gallop  Breast Exam:    Deferred to gyn  Abdomen:     Soft, non-tender, bowel sounds active all four quadrants,    no masses,  no organomegaly  Genitalia:    Deferred to GYN     Extremities:   Extremities normal, atraumatic, no cyanosis or edema  Pulses:   2+ and symmetric all extremities  Skin:   Skin color, texture, turgor normal, no rashes or lesions  Lymph nodes:   Cervical, supraclavicular, and axillary nodes normal  Neurologic:   CNII-XII intact, normal strength, sensation and reflexes    throughout          Assessment & Plan:

## 2010-10-18 ENCOUNTER — Other Ambulatory Visit: Payer: Self-pay | Admitting: *Deleted

## 2010-10-18 DIAGNOSIS — Z0000 Encounter for general adult medical examination without abnormal findings: Secondary | ICD-10-CM

## 2010-10-19 ENCOUNTER — Other Ambulatory Visit: Payer: 59

## 2010-10-19 NOTE — Discharge Summary (Signed)
[name], [name]               ACCOUNT NO.:  [id number]   MEDICAL RECORD NO.:  [id number]          PATIENT TYPE:  INP   LOCATION:  9108                          FACILITY:  [facility]   PHYSICIAN:  Carrington Clamp, M.D. DATE OF BIRTH:  08-31-1975   DATE OF ADMISSION:  01/24/2006  DATE OF DISCHARGE:  01/26/2006                                 DISCHARGE SUMMARY   FINAL DIAGNOSES:  1. Intrauterine pregnancy at term.  2. History of prior cesarean section.  3. Pregnancy-induced hypertension.  4. Multiparous and desires permanent sterilization.   PROCEDURE:  Repeat low transverse cesarean section with bilateral tubal  ligation.   SURGEON:  Dr. Carrington Clamp.   ASSISTANT:  Dr. Conley Simmonds.   COMPLICATIONS:  None.   This 35 year old, G2 P1, at 0-0-1, presents at term for repeat cesarean  section.  The patient's antepartum course had been uncomplicated.  I do not  see her group B strep record in the chart.  The patient does have some  pregnancy induced high blood pressure, just starting the end of this  pregnancy, desires repeat cesarean section, desires permanent sterilization.  She is taken to the operating room on January 24, 2006, by Dr. Carrington Clamp, where a repeat low transverse cesarean section was performed with  the delivery of a 6 pounds 8 ounces female infant with Apgars of 9 and 9.  Delivery went without complication.  The patient still expressed her desire  for permanent sterilization and this was performed as well.   The patient's postoperative course was benign without any significant  fevers.  Her blood pressures were still elevated postoperatively but were  stable.  She was started on some Procardia for elevated blood pressures and  was felt ready for discharge on postoperative day #2.   She was sent home on a regular diet, told to decrease her activities, told  to continue her Procardia 10 mg t.i.d.  She was to return to our [place]  office for her staple  removal the next day, was given Percocet 1-2 every  four to six hours as needed for pain, told to continue her iron supplement,  and of course to call with any increased bleeding, pain, or problems.   DISCHARGE LABORATORY:  The patient had a hemoglobin of 9.4, white blood cell  count of 7.2, platelets of 142,000.  Normal liver function tests.      Leilani Able, P.A.-C.      Carrington Clamp, M.D.  Electronically Signed    MB/MEDQ  D:  03/25/2006  T:  03/25/2006  Job:  [id number]

## 2010-10-19 NOTE — Op Note (Signed)
NAME:  SHAYLEN, NEPHEW                         ACCOUNT NO.:  [id number]   MEDICAL RECORD NO.:  [id number]                   PATIENT TYPE:  INP   LOCATION:  9128                                 [facility]ACILITY[facility]   PHYSICIAN:  Carrington Clamp, M.D.              DATE OF BIRTH:  01-30-1976   DATE OF PROCEDURE:  04/23/2002  DATE OF DISCHARGE:                                 OPERATIVE REPORT   PREOPERATIVE DIAGNOSES:  1. Arrest of active phase.  2. Induction for pregnancy-induced hypertension at term.   POSTOPERATIVE DIAGNOSES:  1. Arrest of active phase.  2. Induction for pregnancy-induced hypertension at term.   PROCEDURE:  Primary low transverse cesarean section.   SURGEON:  Carrington Clamp, M.D.   ASSISTANT:  Mary Sella. Orlene Erm, M.D.   ANESTHESIA:  Epidural.   ESTIMATED BLOOD LOSS:  500 cc.   FLUIDS REPLACED:  2800 cc.   URINE OUTPUT:  200 cc.   COMPLICATIONS:  None.   FINDINGS:  A female infant in vertex presentation with Apgars of 8 and 9 and  weight 9 pounds 4 ounces.  Normal tubes, ovaries, and uterus seen.   MEDICATIONS:  Cefotan, Pitocin, and Methergine.   COUNTS:  Correct x3.   DESCRIPTION OF PROCEDURE:  After adequate epidural anesthesia was achieved,  the patient was prepped and draped in the usual sterile fashion in the  dorsal supine position with a leftward tilt.  A Pfannenstiel skin incision  was made with the scalpel and carried down through fascia with the Bovie  cautery.  The fascia was incised in the midline with a scalpel and carried  in a transverse curvilinear manner with the Mayo scissors.  The fascia was  reflected superiorly and inferiorly from the rectus muscles and the rectus  muscles split in the midline.  The bowel free portion of the peritoneum was  entered into bluntly and the Metzenbaum's used to incise the peritoneum in  inferior and superior manner with a good visualization of the bowel and the  bladder.  The bladder blade was then placed  and the vesicouterine fascia was  incised in a transverse curvilinear manner with the blunt dissection of the  bladder flap.  The bladder blade was replaced and a 2 cm transverse incision  was made in the upper portion of the lower uterine segment.  Clear fluid was  noted upon entry of the amnion, and the incision was extended in a  transverse curvilinear manner with the bandage scissors.  The baby was noted  to be in the vertex presentation and delivered without complication.  The  baby was bulb-suctioned, the cord was clamped and cut, and cord bloods were  obtained.  The placenta was expressed and the uterus was exteriorized, wet  in wet lap, and cleared of all debris.  A 0 Monocryl was used to close the  uterus with a running locked stitch.  An imbricating layer  was used to  ensure hemostasis.  A dose of Methergine was given at this point because  even after 40 units of Pitocin placed in an IV bag and run at full open, the  uterus was still somewhat boggy.  The uterus firmed up and was hemostatic,  and the uterus was replaced into the abdominal cavity.  Irrigation was  performed and the uterine incision reinspected and found to be hemostatic.  The peritoneum was then closed with a running stitch of 2-0 Vicryl, the  fascia was closed with a running stitch of 0 Vicryl.  Subcutaneous tissue  was rendered hemostatic with the Bovie cautery and irrigation, and the skin  was closed with staples.  The patient tolerated the procedure well and was  returned to the recovery room in stable condition.                                               Carrington Clamp, M.D.    MH/MEDQ  D:  04/23/2002  T:  04/23/2002  Job:  [id number]

## 2010-10-19 NOTE — Op Note (Signed)
NAMESABRE, [name]               ACCOUNT NO.:  [id number]   MEDICAL RECORD NO.:  [id number]          PATIENT TYPE:  INP   LOCATION:  NA                            [facility]  [facility]   PHYSICIAN:  Carrington Clamp, M.D. DATE OF BIRTH:  [birth year]   DATE OF PROCEDURE:  01/24/2006  DATE OF DISCHARGE:                                 OPERATIVE REPORT   PREOPERATIVE DIAGNOSIS:  1. Repeat cesarean section at term.  2. Pregnancy induced hypertension.  3. Multiparous, desires permit sterility.   POSTOPERATIVE DIAGNOSIS:  1. Repeat cesarean section at term.  2. Pregnancy induced hypertension.  3. Multiparous, desires permit sterility.   PROCEDURE:  Secondary low transverse cesarean section with bilateral tubal  of ligation.   SURGEON:  Carrington Clamp, M.D.   ASSISTANT:  Randye Lobo, M.D.   ANESTHESIA:  Spinal.   SPECIMENS:  Placenta and right and left tubal segments.   ESTIMATED BLOOD LOSS:  600 mL.   IV FLUIDS:  3000 mL.   URINE OUTPUT:  200 mL.   COMPLICATIONS:  None.   FINDINGS:  Female infant in a vertex presentation, Apgars 9/9. Normal tubes,  ovaries and uterus seen.   MEDICATIONS:  Pitocin, Hemabate and Ancef.   COUNTS:  Correct x3.   TECHNIQUE:  After adequate spinal anesthesia was achieved, the patient was  prepped and draped in the usual sterile fashion in the dorsal supine  position with a leftward tilt.  A Pfannenstiel skin incision was made with  the scalpel and carried down to the fascia with the Bovie cautery.  The  fascia was incised in the midline with the scalpel and then incised in a  transverse curvilinear manner with the Mayo scissors.  The rectus muscles  were split in midline and a bowel free portion of the peritoneum entered  into carefully with the Metzenbaum scissors.  The peritoneum was then  incised in an superior and inferior manner with the Metzenbaum scissors with  good visualization of the bowel and bladder.   Although there was a  small adhesion of omentum to the anterior peritoneum,  it was out of the operating field and, therefore, was pulled out of the way  with the [facility] retractor.  The vesicouterine fascia was tented up with  smooth forceps and incised in a transverse curvilinear manner with the  Metzenbaum scissors.  The bladder flap was created with blunt dissection and  the bladder blade was then replaced.  A 2 cm incision was made in the upper  portion of the lower uterine segment transversely until clear fluid could be  noted on entry into the amnion.  The incision was extended with the bandage  scissors and the baby identified in the vertex presentation and delivered  with the aid of a vacuum extractor.  The baby was bulb suctioned and the  cord was clamped and cut and the baby was handed to awaiting pediatrics.  The placenta was delivered manually and the uterus exteriorized, wrapped in  a wet lap, and cleared of all debris.  The uterine incision was then closed  with a  running stitch of 0 Monocryl.  An imbricating layer of 0 Monocryl was  used, as well.  Hemostasis was achieved with Bovie cautery.   The attention was then turned to the tubes which were tented up with a  Babcock.  The incision was made in a bowel free portion of the peritoneum  with the Bovie cautery.  Two freehand ties of 0 plain gut were passed for  each side and each side was tied down twice.  The intervening segment of  tube that was about 2 cm long was then excised with the Metzenbaum scissors.  The procedure was performed on both sides and hemostasis achieved.   The uterus was reapproximated in the abdomen and the abdomen cleared of all  debris with irrigation.  The tubal sites were found to be hemostatic as well  as the uterine incision.  The peritoneum was then closed with a running  stitch of 2-0 Vicryl.  The fascia was closed with a running stitch of 0  Vicryl.  The subcutaneous tissue was rendered hemostatic with Bovie  cautery  and irrigation and then closed with interrupted stitches of plain gut.  The  skin was closed with staples.  The patient tolerated the procedure well and  was returned to the recovery room in stable condition.      Carrington Clamp, M.D.  Electronically Signed     MH/MEDQ  D:  01/24/2006  T:  01/24/2006  Job:  [id number]

## 2010-10-19 NOTE — Discharge Summary (Signed)
   NAME:  [name], [name]                         ACCOUNT NO.:  [id number]   MEDICAL RECORD NO.:  [id number]                   PATIENT TYPE:  INP   LOCATION:  9128                                 [facility]ACILITY:  [facility]   PHYSICIAN:  Ilda Mori, M.D.                DATE OF BIRTH:  01/09/1976   DATE OF ADMISSION:  04/22/2002  DATE OF DISCHARGE:  04/25/2002                                 DISCHARGE SUMMARY   FINAL DIAGNOSES:  1. Pregnancy induction secondary to induced hypertension at term.  2. Arrest of active phase of labor.   PROCEDURE:  Primary low transverse cesarean section.   SURGEON:  Carrington Clamp, M.D.   ASSISTANT:  Mary Sella. Orlene Erm, M.D.   CONSULTATIONS:  None.   HISTORY OF PRESENT ILLNESS:  This 35 year old G2 P0 presents at 53 and five-  sevenths weeks gestation with pregnancy-induced hypertension.  She does not  have any proteinuria.  She has had this pregnancy-induced hypertension since  about [redacted] weeks gestation.  She has been asymptomatic and she has been  followed in the office with nonstress tests that have all been reactive.  Otherwise, the patient's antepartum course has been uncomplicated.   HOSPITAL COURSE:  The patient was admitted for induction secondary to the  [facility] with a favorable cervix.  Upon admission the patient's cervix was about  2-3 cm dilated, 80% effaced, and -2 station.  She was admitted.  The patient  then had a good contraction pattern but after six hours there was no change  in her cervix.  At this point it was decided to take the patient for a  primary low transverse cesarean section secondary to arrest of labor.  The  patient was taken to the operating room by Dr. Carrington Clamp where a  primary low transverse cesarean section was performed with the delivery of a  9 pound 4 ounce female infant with Apgars of 8 and 9 was delivered.  The  delivery went without complications.  The patient's postoperative course was  benign without significant  fevers.  The patient was felt ready for discharge  on postoperative day #2.   DIET:  Sent home on a regular diet.   ACTIVITY:  Told to decrease activities.   MEDICATIONS:  1. Continue prenatal vitamins.  2. Given a prescription for Tylox #30 one to two q.4h. as needed for pain.  3. Told she could use some over-the-counter pain medicines as well.   FOLLOW-UP:  Follow up in the office in four weeks.    Leilani Able, P.A.-C.                Ilda Mori, M.D.   MB/MEDQ  D:  06/16/2002  T:  06/16/2002  Job:  [id number]

## 2010-10-19 NOTE — Op Note (Signed)
[name], [name] NO.:  [id number]   MEDICAL RECORD NO.:  [id number]          PATIENT TYPE:  AMB   LOCATION:  SDC                           [facility]ILITY:  [facility]   PHYSICIAN:  Carrington Clamp, M.D. DATE OF BIRTH:  18-Jun-1975   DATE OF PROCEDURE:  01/30/2006  DATE OF DISCHARGE:                                 OPERATIVE REPORT   PREOPERATIVE DIAGNOSIS:  Endometritis and postpartum hemorrhage.   POSTOPERATIVE DIAGNOSIS:  Endometritis and postpartum hemorrhage, retained  infected blood clot.   PROCEDURE:  Dilation and evacuation.   SURGEON:  Dr. Henderson Cloud   ANESTHESIA:  General.   SPECIMENS:  Uterine contents.   DISPOSITION:  To pathology.   ESTIMATED BLOOD LOSS:  Minimal.  Intraoperative bleed but 1000 mL of blood  clot removed.   COMPLICATIONS:  None.   FINDINGS:  Uterus about 20 weeks' size down to 15 weeks' size postop.  Incision was palpated side to side through the vagina and cervix and felt  intact.  There is no active bleeding.  There is no vaginal lacerations or  cervical tears.   MEDICATIONS:  None.   COUNTS:  Correct x3.   TECHNIQUE:  After adequate general anesthesia was achieved, the patient was  prepped and draped in usual sterile fashion in dorsal lithotomy position.  The bladder was emptied with a Foley catheter and the speculum placed in the  vagina.  The cervix was grasped with a ring forceps after palpation revealed  the cervix was markedly dilated and thinned out.  There is a large amount of  clot felt in the lower uterine segment and at the os.  A 14 mm curette was  passed into the os and continued alternating suction and manual removal of  clot was performed.  Blood clot appeared to be all old and foul-smelling as  if infected.  There was no active bleeding seen.  Once the majority of the  clot had been removed from the uterus, palpation was able to be performed of  the incision in the lower uterine segment and stitches fell intact.   There  were no defects in the incision.  The uterus was slightly boggy but much  smaller size and there was no active bleeding at this point in time.  Given  that the patient was clearly infected from the blood clot and there was no  apparent active bleeding, it was decided against using the Bakri uterine  balloon.  The patient was watched for about 5 minutes with alternating  uterine massage and to make sure that bleeding did not  pick up.  Once we were sure that the bleeding was stable and minimal, all  instruments were withdrawn from the vagina and the Foley catheter hooked up.  The patient tolerated the procedure well, was returned to recovery in stable  condition.  Her antibiotics will be changed to Unasyn, and she will be kept  overnight for observation.      Carrington Clamp, M.D.  Electronically Signed     MH/MEDQ  D:  02/07/2006  T:  02/08/2006  Job:  [id number]

## 2010-10-22 ENCOUNTER — Other Ambulatory Visit (INDEPENDENT_AMBULATORY_CARE_PROVIDER_SITE_OTHER): Payer: 59

## 2010-10-22 DIAGNOSIS — Z0000 Encounter for general adult medical examination without abnormal findings: Secondary | ICD-10-CM

## 2010-10-22 DIAGNOSIS — I10 Essential (primary) hypertension: Secondary | ICD-10-CM

## 2010-10-22 LAB — CBC WITH DIFFERENTIAL/PLATELET
Basophils Relative: 0.3 % (ref 0.0–3.0)
Eosinophils Relative: 2.5 % (ref 0.0–5.0)
HCT: 40.3 % (ref 36.0–46.0)
Hemoglobin: 13.8 g/dL (ref 12.0–15.0)
Lymphs Abs: 2.3 10*3/uL (ref 0.7–4.0)
Monocytes Relative: 7.2 % (ref 3.0–12.0)
Neutro Abs: 4.2 10*3/uL (ref 1.4–7.7)
RBC: 4.51 Mil/uL (ref 3.87–5.11)
RDW: 12.6 % (ref 11.5–14.6)
WBC: 7.3 10*3/uL (ref 4.5–10.5)

## 2010-10-22 LAB — HEPATIC FUNCTION PANEL
AST: 16 U/L (ref 0–37)
Albumin: 4 g/dL (ref 3.5–5.2)
Alkaline Phosphatase: 66 U/L (ref 39–117)
Total Protein: 6.7 g/dL (ref 6.0–8.3)

## 2010-10-22 LAB — BASIC METABOLIC PANEL
BUN: 11 mg/dL (ref 6–23)
CO2: 27 mEq/L (ref 19–32)
Calcium: 9.3 mg/dL (ref 8.4–10.5)
Chloride: 102 mEq/L (ref 96–112)
Creatinine, Ser: 0.8 mg/dL (ref 0.4–1.2)
Glucose, Bld: 79 mg/dL (ref 70–99)

## 2010-10-22 LAB — TSH: TSH: 0.28 u[IU]/mL — ABNORMAL LOW (ref 0.35–5.50)

## 2010-10-22 LAB — LIPID PANEL
LDL Cholesterol: 108 mg/dL — ABNORMAL HIGH (ref 0–99)
Total CHOL/HDL Ratio: 4

## 2010-10-23 ENCOUNTER — Encounter: Payer: Self-pay | Admitting: *Deleted

## 2010-10-23 ENCOUNTER — Telehealth: Payer: Self-pay | Admitting: *Deleted

## 2010-10-23 NOTE — Telephone Encounter (Signed)
Discuss with patient  

## 2010-10-23 NOTE — Telephone Encounter (Addendum)
Left message to call office, copy of labs mailed.  Message copied by Candie Echevaria on Tue Oct 23, 2010  9:56 AM ------      Message from: Neena Rhymes      Created: Tue Oct 23, 2010  7:56 AM       T3/T4 normal despite abnormal TSH.  Repeat thyroid labs in 3-6 months

## 2010-10-30 DIAGNOSIS — H9209 Otalgia, unspecified ear: Secondary | ICD-10-CM | POA: Insufficient documentation

## 2010-10-30 DIAGNOSIS — R3915 Urgency of urination: Secondary | ICD-10-CM | POA: Insufficient documentation

## 2010-10-30 NOTE — Assessment & Plan Note (Signed)
Pt would like to wean prozac if possible.  Will decrease to 20mg  daily.  When she feels ready we will decrease to 10mg .

## 2010-10-30 NOTE — Assessment & Plan Note (Signed)
CPE WNL.  Check labs when pt returns fasting.  UTD on GYN.  Anticipatory guidance provided- pt still attempting to quit smoking.  Will follow.

## 2010-10-30 NOTE — Assessment & Plan Note (Signed)
No evidence of infxn.  Most likely related to nasal congestion and altitude changes from [place] the mountains.  Nasal decongestants prn.  Reviewed supportive care and red flags that should prompt return.  Pt expressed understanding and is in agreement w/ plan.

## 2010-10-30 NOTE — Assessment & Plan Note (Signed)
Pt having urgency just in the last few months.  Start Vesicare and see if sxs improve.  If not- refer to urology.  Pt expressed understanding and is in agreement w/ plan.

## 2010-10-30 NOTE — Assessment & Plan Note (Signed)
BP well controlled.  Asymptomatic.  No changes at this time. 

## 2010-11-19 ENCOUNTER — Telehealth: Payer: Self-pay

## 2010-11-19 MED ORDER — FLUTICASONE PROPIONATE 50 MCG/ACT NA SUSP: 2.0000 | Freq: Every day | NASAL | Status: DC

## 2010-11-19 MED ORDER — FLUOXETINE HCL 10 MG PO CAPS: 20.0000 mg | ORAL_CAPSULE | Freq: Every day | ORAL | Status: DC

## 2010-11-19 MED ORDER — FLUOXETINE HCL 10 MG PO TABS: 10.0000 mg | ORAL_TABLET | Freq: Every day | ORAL | Status: AC

## 2010-11-19 NOTE — Telephone Encounter (Signed)
Patient is currently weaning off of Prozac and ready to decrease to 10 mg. How long would you like for the patient to take he 10 mg before decreasing it again? Rx sent to [facility] in [place] ----  Please advise.  KP

## 2010-11-19 NOTE — Telephone Encounter (Signed)
Spoke with patient and made her aware of Dr.Tabori's recommendations. She voiced understanding to take 10 mg daily for 1 month and then 1/2 for 1 month and will call with any concerns. She also asked for a refill of Rx faxed and Prozac 10 mg tabs were sent to the pharmacy.      KP

## 2010-11-19 NOTE — Telephone Encounter (Signed)
She should take the 10mg  for 1 month and then she can break the tab in 1/2 for 1 month and then if need be, go to every other day for 2 weeks and then stop.  Or she can stop after the 1/2 tab.

## 2011-05-13 ENCOUNTER — Encounter: Payer: Self-pay | Admitting: Family Medicine

## 2011-05-13 ENCOUNTER — Ambulatory Visit (INDEPENDENT_AMBULATORY_CARE_PROVIDER_SITE_OTHER): Payer: 59 | Admitting: Family Medicine

## 2011-05-13 DIAGNOSIS — J309 Allergic rhinitis, unspecified: Secondary | ICD-10-CM

## 2011-05-13 DIAGNOSIS — F17200 Nicotine dependence, unspecified, uncomplicated: Secondary | ICD-10-CM

## 2011-05-13 DIAGNOSIS — I10 Essential (primary) hypertension: Secondary | ICD-10-CM

## 2011-05-13 MED ORDER — FLUTICASONE PROPIONATE 50 MCG/ACT NA SUSP: 2.0000 | Freq: Every day | NASAL | Status: DC

## 2011-05-13 NOTE — Assessment & Plan Note (Signed)
Chronic problem.  Not well controlled b/c pt has been out of meds for months.  Refill provided.

## 2011-05-13 NOTE — Progress Notes (Signed)
  Subjective:    Patient ID: [name], female    DOB: 03/12/1976, 35 y.o.   MRN: [id number]  HPI HTN- chronic problem.  Excellent control today.  On Norvasc and Lisinopril.  Denies CP, SOB, HAs, visual changes, edema.  Rhinitis- chronic problem, has been out of flonase x3 months.  Needs refill.  Some HAs w/ continued temperature fluctuation.  Tobacco use- back up to 1/2 ppd.  Is typically smoking in the evenings.  'it's boredom'.  Previously was using the patch w/ good results.   Review of Systems For ROS see HPI     Objective:   Physical Exam  Vitals reviewed. Constitutional: She is oriented to person, place, and time. She appears well-developed and well-nourished. No distress.  HENT:  Head: Normocephalic and atraumatic.  Eyes: Conjunctivae and EOM are normal. Pupils are equal, round, and reactive to light.  Neck: Normal range of motion. Neck supple. No thyromegaly present.  Cardiovascular: Normal rate, regular rhythm, normal heart sounds and intact distal pulses.   No murmur heard. Pulmonary/Chest: Effort normal and breath sounds normal. No respiratory distress.  Abdominal: Soft. She exhibits no distension. There is no tenderness.  Musculoskeletal: She exhibits no edema.  Lymphadenopathy:    She has no cervical adenopathy.  Neurological: She is alert and oriented to person, place, and time.  Skin: Skin is warm and dry.  Psychiatric: She has a normal mood and affect. Her behavior is normal.          Assessment & Plan:

## 2011-05-13 NOTE — Assessment & Plan Note (Signed)
Chronic problem.  Well controlled on current meds.  Asymptomatic.  No changes. 

## 2011-05-13 NOTE — Patient Instructions (Signed)
Schedule your complete physical for May You look great! Restart the patch to quit smoking when you're ready Call with any questions or concerns Happy Holidays!!!

## 2011-05-13 NOTE — Assessment & Plan Note (Signed)
Chronic problem.  Pt had scaled back using the patch.  Pt fears doing this again b/c of the weight gain.  Stressed importance of cessation.  Pt to consider this.

## 2011-06-06 ENCOUNTER — Ambulatory Visit (INDEPENDENT_AMBULATORY_CARE_PROVIDER_SITE_OTHER)
Admission: RE | Admit: 2011-06-06 | Discharge: 2011-06-06 | Disposition: A | Discharge status: Home / Self Care | Payer: 59 | Source: Ambulatory Visit | Attending: Family Medicine | Admitting: Family Medicine

## 2011-06-06 ENCOUNTER — Ambulatory Visit (INDEPENDENT_AMBULATORY_CARE_PROVIDER_SITE_OTHER): Payer: 59 | Admitting: Family Medicine

## 2011-06-06 ENCOUNTER — Encounter: Payer: Self-pay | Admitting: Family Medicine

## 2011-06-06 VITALS — BP 130/80 | HR 75 | Temp 98.7°F | Ht 62.0 in | Wt 157.0 lb

## 2011-06-06 DIAGNOSIS — M25569 Pain in unspecified knee: Secondary | ICD-10-CM

## 2011-06-06 DIAGNOSIS — M2390 Unspecified internal derangement of unspecified knee: Secondary | ICD-10-CM

## 2011-06-06 DIAGNOSIS — M25561 Pain in right knee: Secondary | ICD-10-CM

## 2011-06-06 MED ORDER — DICLOFENAC SODIUM 75 MG PO TBEC: 75.0000 mg | DELAYED_RELEASE_TABLET | Freq: Two times a day (BID) | ORAL | Status: AC

## 2011-06-06 NOTE — Patient Instructions (Signed)
After you have an injection of any joint, the numbing medicine will make it feel better for a few hours. Later tonight, it is common for the joint to feel worse. The steroid will take 48-72 hours to start working - it is the thing that will likely provide the most relief.  Ice the joint where you had the injection at least 2-3 times a day for 20 minutes for 3 days. If have had swelling, pain in the joint itself, ice for 1 week.  You can use an ice bag, frozen peas or corn, an ice pack - all work   Engineer, maintenance (IT) in 3 weeks with Dr. Patsy Lager

## 2011-06-06 NOTE — Progress Notes (Addendum)
Patient Name: [name] Date of Birth: 01/30/1976 Age: 36 y.o. Medical Record Number: [id number] Gender: female Date of Encounter: 06/06/2011  History of Present Illness:  [name] is a 36 y.o. very pleasant female patient who I remember well that I saw last year with some LEFT knee problems primarily, and she is generally doing better with her LEFT knee and was able to become more active. Over the last 2 months she has been relatively physically inactive.  Prior to Christmas, she does recall doing a lot of heavy cleaning where she was squatting and twisting in cleaning floors and baseboards. Since that time she has had some very specific and significant right-sided knee pain it was not present previously. She has had some ongoing patellofemoral syndrome and occasionally will flare up and is slowed her running progression compared to when she was younger.  No mechanical locking up. No symptomatic giving way. No prior surgical or fracture history of the RIGHT knee.  Past Medical History, Surgical History, Social History, Family History, Problem List, Medications, and Allergies have been reviewed and updated if relevant.  Review of Systems:  GEN: No fevers, chills. Nontoxic. Primarily MSK c/o today. MSK: Detailed in the HPI GI: tolerating PO intake without difficulty Neuro: No numbness, parasthesias, or tingling associated. Otherwise the pertinent positives of the ROS are noted above.    Physical Examination: Filed Vitals:   06/06/11 1603  BP: 130/80  Pulse: 75  Temp: 98.7 F (37.1 C)  TempSrc: Oral  Height: 5\' 2"  (1.575 m)  Weight: 157 lb (71.215 kg)  SpO2: 100%    Body mass index is 28.72 kg/(m^2).   GEN: WDWN, NAD, Non-toxic, Alert & Oriented x 3 HEENT: Atraumatic, Normocephalic.  Ears and Nose: No external deformity. EXTR: No clubbing/cyanosis/edema NEURO: Normal gait.  PSYCH: Normally interactive. Conversant. Not depressed or anxious appearing.  Calm  demeanor.   RIGHT knee: Large ballotable effusion. Moderate tenderness with medial and lateral facet loading. Positive grind maneuver. No patellar instability. Stable varus and valgus stress. Negative Lachman. Negative posterior and anterior drawer testing. There is some medial joint line tenderness. She also has a large painful plical band medially on the RIGHT knee as well. Negative bounce home testing. All other meniscal testing is equivocal given her large effusion. Extension to 0 and flexion to 120.  LEFT knee: Full normal range of motion with no effusion. Mild medial lateral patellar facet irritation and pain to palpation. Otherwise grossly normal examination.  Assessment and Plan: 1. Knee pain, right  DG Knee Complete 4 Views Right, diclofenac (VOLTAREN) 75 MG EC tablet  2. Internal derangement of knee      RIGHT knee, 4 views.  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  Large effusion for age. Clinical concern for knee injury, potential internal derangement, possible meniscal pathology during heavy cleaning and deep flexion of the RIGHT knee. For now, since this is only been a short time, we'll place her on some anti-inflammatories, aspirate her knee and inject with some corticosteroid. Then we can reassess her in the in 3 weeks.  Knee Aspiration and Injection Patient verbally consented; risks, benefits, and alternatives explained including possible infection. Patient prepped with Chloraprep. Ethyl chloride for anesthesia. 5 cc of 1% Lidocaine used in wheal then injected Subcutaneous fashion with 27 gauge needle on lateral approach. Under sterile conditions, 18 gauge needle used via lateral approach to aspirate 25 cc of serosanguinous fluid. Then 9  cc of Lidocaine 1% and 1 cc of Kenalog 40 mg injected. Tolerated well, decreased pain, no complications.    Addendum: The patient has called into  the office, and she had good relief of symptoms for 3 days, but now had a return of symptoms, somewhat worse with some difficulty walking. With effusion, positive flexion pinch and painful mcmurrays, failure of conservative management, will obtain a MRI of the right knee to further evaluate for internal derangement. Hannah Beat, MD 06/16/2011 11:17 AM

## 2011-06-13 ENCOUNTER — Telehealth: Payer: Self-pay | Admitting: Internal Medicine

## 2011-06-13 ENCOUNTER — Telehealth: Payer: Self-pay | Admitting: Family Medicine

## 2011-06-13 MED ORDER — LISINOPRIL 10 MG PO TABS: 10.0000 mg | ORAL_TABLET | Freq: Every day | ORAL | Status: DC

## 2011-06-13 MED ORDER — TRAMADOL HCL 50 MG PO TABS: 50.0000 mg | ORAL_TABLET | Freq: Two times a day (BID) | ORAL | Status: AC | PRN

## 2011-06-13 NOTE — Telephone Encounter (Signed)
Patient advised.

## 2011-06-13 NOTE — Telephone Encounter (Signed)
We can temporarily try tramadol for pain. Hold diclofenac for now while on tramadol as both can irritate stomach. If not helping with pain.. Call Dr. Patsy Lager back for further recommendations.

## 2011-06-13 NOTE — Telephone Encounter (Signed)
Is she still using diclofenac 75 mg BID for pain?

## 2011-06-13 NOTE — Telephone Encounter (Signed)
rx sent to pharmacy by e-script Per escribe was down yesterday

## 2011-06-13 NOTE — Telephone Encounter (Signed)
Patient was seen on 06/06/11 and was given steriod shots in her right knee and it was better for 3 days.  Now the pain is worse and last night she had a hard time sleeping because the pain was so bad and she would like to know what to do for this.  Please advise.

## 2011-06-13 NOTE — Telephone Encounter (Signed)
Patient is using the diclofenac BID  [facility] uses [facility] on [address]

## 2011-06-13 NOTE — Telephone Encounter (Signed)
Patient states that she has called her pharmacy regarding refill on lisinopril, pharmacy told her that they have sent [place] two refill requests but have not heard back from [place]. [facility] on [address] in [place].

## 2011-06-16 NOTE — Telephone Encounter (Signed)
All noted. See my note addendum.

## 2011-06-16 NOTE — Progress Notes (Signed)
Addended by: Hannah Beat on: 06/16/2011 11:19 AM   Modules accepted: Orders

## 2011-06-17 ENCOUNTER — Telehealth: Payer: Self-pay | Admitting: Internal Medicine

## 2011-06-17 NOTE — Telephone Encounter (Signed)
Patient called and stated she has more fluid on her right knee and wanted to know if you wanted to see her prior to her MRI.  Please advise.

## 2011-06-17 NOTE — Telephone Encounter (Signed)
i can always recheck her if she wants to before getting imaging - that is very reasonable. Keep antiinflammatories on board, ice knee, and i can recheck on thurs. Help set up

## 2011-06-18 NOTE — Telephone Encounter (Signed)
Patient has appt on Thursday at 8:45 am

## 2011-06-19 ENCOUNTER — Ambulatory Visit
Admission: RE | Admit: 2011-06-19 | Discharge: 2011-06-19 | Disposition: A | Discharge status: Home / Self Care | Payer: 59 | Source: Ambulatory Visit | Attending: Family Medicine | Admitting: Family Medicine

## 2011-06-19 ENCOUNTER — Other Ambulatory Visit: Payer: Self-pay | Admitting: Family Medicine

## 2011-06-19 DIAGNOSIS — M25561 Pain in right knee: Secondary | ICD-10-CM

## 2011-06-19 DIAGNOSIS — S83249A Other tear of medial meniscus, current injury, unspecified knee, initial encounter: Secondary | ICD-10-CM

## 2011-06-20 ENCOUNTER — Ambulatory Visit: Payer: 59 | Admitting: Family Medicine

## 2011-06-20 DIAGNOSIS — Z0289 Encounter for other administrative examinations: Secondary | ICD-10-CM

## 2011-06-27 ENCOUNTER — Ambulatory Visit: Payer: 59 | Admitting: Family Medicine

## 2011-07-12 ENCOUNTER — Telehealth: Payer: Self-pay | Admitting: Family Medicine

## 2011-07-12 MED ORDER — AMLODIPINE BESYLATE 10 MG PO TABS: 10.0000 mg | ORAL_TABLET | Freq: Every day | ORAL | Status: DC

## 2011-07-12 NOTE — Telephone Encounter (Signed)
Rx sent 

## 2011-07-12 NOTE — Telephone Encounter (Signed)
[facility] states that [facility] on [address] in [place] has faxed [place] a rx request for amlodipine twice. Told patient we never received the request. Please send refill to pharmacy.

## 2011-07-25 ENCOUNTER — Other Ambulatory Visit: Payer: Self-pay | Admitting: Obstetrics and Gynecology

## 2011-10-21 ENCOUNTER — Encounter: Payer: Self-pay | Admitting: Family Medicine

## 2011-10-21 ENCOUNTER — Ambulatory Visit (INDEPENDENT_AMBULATORY_CARE_PROVIDER_SITE_OTHER): Payer: 59 | Admitting: Family Medicine

## 2011-10-21 DIAGNOSIS — F329 Major depressive disorder, single episode, unspecified: Secondary | ICD-10-CM

## 2011-10-21 DIAGNOSIS — F341 Dysthymic disorder: Secondary | ICD-10-CM

## 2011-10-21 DIAGNOSIS — Z0000 Encounter for general adult medical examination without abnormal findings: Secondary | ICD-10-CM

## 2011-10-21 DIAGNOSIS — I10 Essential (primary) hypertension: Secondary | ICD-10-CM

## 2011-10-21 LAB — CBC WITH DIFFERENTIAL/PLATELET
Basophils Relative: 0.4 % (ref 0.0–3.0)
Eosinophils Absolute: 0.1 10*3/uL (ref 0.0–0.7)
Eosinophils Relative: 2.2 % (ref 0.0–5.0)
HCT: 38.9 % (ref 36.0–46.0)
Hemoglobin: 13 g/dL (ref 12.0–15.0)
MCHC: 33.5 g/dL (ref 30.0–36.0)
MCV: 88.9 fl (ref 78.0–100.0)
Monocytes Absolute: 0.4 10*3/uL (ref 0.1–1.0)
Neutro Abs: 3.7 10*3/uL (ref 1.4–7.7)
Neutrophils Relative %: 56.2 % (ref 43.0–77.0)
RBC: 4.37 Mil/uL (ref 3.87–5.11)
WBC: 6.6 10*3/uL (ref 4.5–10.5)

## 2011-10-21 LAB — BASIC METABOLIC PANEL
BUN: 12 mg/dL (ref 6–23)
Creatinine, Ser: 0.7 mg/dL (ref 0.4–1.2)
GFR: 106.61 mL/min (ref >60.00)
Glucose, Bld: 85 mg/dL (ref 70–99)
Potassium: 4.4 mEq/L (ref 3.5–5.1)

## 2011-10-21 LAB — HEPATIC FUNCTION PANEL
ALT: 15 U/L (ref 0–35)
AST: 18 U/L (ref 0–37)
Albumin: 4.1 g/dL (ref 3.5–5.2)

## 2011-10-21 LAB — LIPID PANEL
Cholesterol: 153 mg/dL (ref 0–200)
HDL: 49.1 mg/dL (ref >39.00)

## 2011-10-21 MED ORDER — ALPRAZOLAM 0.5 MG PO TABS: ORAL_TABLET | ORAL | Status: DC

## 2011-10-21 MED ORDER — ALPRAZOLAM 0.5 MG PO TBDP: 0.5000 mg | ORAL_TABLET | Freq: Two times a day (BID) | ORAL | Status: DC | PRN

## 2011-10-21 NOTE — Patient Instructions (Signed)
Follow up in 6 months to recheck BP Start the Xanax as needed for anxiety Keep up the good work on diet and exercise- you look great! We'll notify you of your lab results Call with any questions or concerns Hang in there!!!

## 2011-10-21 NOTE — Assessment & Plan Note (Signed)
Adequately controlled.  Check labs.  No med changes at this time.

## 2011-10-21 NOTE — Progress Notes (Signed)
  Subjective:    Patient ID: [name], female    DOB: Jun 13, 1975, 36 y.o.   MRN: [id number]  HPI CPE- UTD on GYN.  Still smoking.  Anxiety- hx of similar.  Previously on Prozac, has been off x6 months.  Not interested in daily med, prefers xanax prn.  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   Review of Systems Patient reports no vision/ hearing changes, adenopathy,fever, weight change,  persistant/recurrent hoarseness , swallowing issues, chest pain, palpitations, edema, persistant/recurrent cough, hemoptysis, dyspnea (rest/exertional/paroxysmal nocturnal), gastrointestinal bleeding (melena, rectal bleeding), abdominal pain, significant heartburn, bowel changes, GU symptoms (dysuria, hematuria, incontinence), Gyn symptoms (abnormal  bleeding, pain),  syncope, focal weakness, memory loss, numbness & tingling, skin/hair/nail changes, abnormal bruising or bleeding, depression.     Objective:   Physical Exam General Appearance:    Alert, cooperative, no distress, appears stated age  Head:    Normocephalic, without obvious abnormality, atraumatic  Eyes:    PERRL, conjunctiva/corneas clear, EOM's intact, fundi    benign, both eyes  Ears:    Normal TM's and external ear canals, both ears  Nose:   Nares normal, septum midline, mucosa normal, no drainage    or sinus tenderness  Throat:   Lips, mucosa, and tongue normal; teeth and gums normal  Neck:   Supple, symmetrical, trachea midline, no adenopathy;    Thyroid: no enlargement/tenderness/nodules  Back:     Symmetric, no curvature, ROM normal, no CVA tenderness  Lungs:     Clear to auscultation bilaterally, respirations unlabored  Chest Wall:    No tenderness or deformity   Heart:    Regular rate and rhythm, S1 and S2 normal, no murmur, rub   or gallop  Breast Exam:    Deferred to GYN  Abdomen:     Soft, non-tender, bowel sounds active all four  quadrants,    no masses, no organomegaly  Genitalia:    Deferred to GYN  Rectal:    Extremities:   Extremities normal, atraumatic, no cyanosis or edema  Pulses:   2+ and symmetric all extremities  Skin:   Skin color, texture, turgor normal, no rashes or lesions  Lymph nodes:   Cervical, supraclavicular, and axillary nodes normal  Neurologic:   CNII-XII intact, normal strength, sensation and reflexes    throughout          Assessment & Plan:

## 2011-10-21 NOTE — Assessment & Plan Note (Signed)
Chronic problem.  Deteriorated.  Start xanax prn.  Will follow.

## 2011-10-21 NOTE — Assessment & Plan Note (Signed)
PE WNL.  UTD on GYN.  Encouraged smoking cessation.  Check labs.  Anticipatory guidance provided.

## 2011-10-22 ENCOUNTER — Other Ambulatory Visit: Payer: Self-pay | Admitting: Physician Assistant

## 2011-10-22 LAB — TSH: TSH: 0.39 u[IU]/mL (ref 0.35–5.50)

## 2011-10-23 ENCOUNTER — Encounter: Payer: Self-pay | Admitting: *Deleted

## 2011-10-23 LAB — VITAMIN D 1,25 DIHYDROXY
Vitamin D 1, 25 (OH)2 Total: 49 pg/mL (ref 18–72)
Vitamin D3 1, 25 (OH)2: 49 pg/mL

## 2011-10-24 ENCOUNTER — Encounter: Payer: Self-pay | Admitting: *Deleted

## 2011-10-29 ENCOUNTER — Telehealth: Payer: Self-pay | Admitting: *Deleted

## 2011-10-29 NOTE — Telephone Encounter (Signed)
Pt left VM that she need refill on med. Pt did not indicate which med she needed a refill on. Left message to call office.

## 2011-10-30 ENCOUNTER — Other Ambulatory Visit: Payer: Self-pay | Admitting: Family Medicine

## 2011-10-30 MED ORDER — AMLODIPINE BESYLATE 10 MG PO TABS: 10.0000 mg | ORAL_TABLET | Freq: Every day | ORAL | Status: DC

## 2011-10-30 MED ORDER — LISINOPRIL 10 MG PO TABS: 10.0000 mg | ORAL_TABLET | Freq: Every day | ORAL | Status: DC

## 2011-10-30 NOTE — Telephone Encounter (Signed)
Patient stated she was told at 5.20-visit her meds for Lisinopril & Amlodipine would be refill. Her Pharmacy has not seen & she is out [facility]?

## 2011-10-30 NOTE — Telephone Encounter (Signed)
rx sent to pharmacy by e-script Called pt to advise the RX has been sent in

## 2011-11-13 NOTE — Telephone Encounter (Signed)
See telephone encounter 10-30-11.

## 2011-12-12 ENCOUNTER — Other Ambulatory Visit: Payer: Self-pay | Admitting: Physician Assistant

## 2011-12-26 ENCOUNTER — Other Ambulatory Visit: Payer: Self-pay | Admitting: Physician Assistant

## 2012-01-23 ENCOUNTER — Other Ambulatory Visit: Payer: Self-pay | Admitting: Physician Assistant

## 2012-01-30 ENCOUNTER — Telehealth: Payer: Self-pay | Admitting: Family Medicine

## 2012-01-30 MED ORDER — TRIAMCINOLONE ACETONIDE 0.1 % EX OINT: TOPICAL_OINTMENT | Freq: Two times a day (BID) | CUTANEOUS | Status: AC

## 2012-01-30 NOTE — Telephone Encounter (Signed)
Should continue to use steroid cream rather than oral steroids.  We can send prescription strength steroid cream to pharmacy- Triamcinolone 0.1% apply twice daily, #90, 1 refill

## 2012-01-30 NOTE — Telephone Encounter (Signed)
rx sent to pharmacy by e-script Left vm to advise the RX has been sent to pharmacy noted, please call office if any further questions or concerns

## 2012-01-30 NOTE — Telephone Encounter (Signed)
Caller: [name]/Patient; Patient Name: [name]; PCP: Sheliah Hatch.; Best Callback Phone Number: [phone].  Patient states she states she had bumps on Left forearm and has spread to both arms and Legs. She believes she got into poison oak /poison Ivy white working in the yard on Friday  01/24/12.  +itching .  Treating with spray lotion poison ivy and benadryl , hyrdrocortisone cream.  Afebrile.  Clear fluid drainage in a few spot.  No signs of infection. She reports that the area do look better since using hydrocortisone cream. Emergent s/sx ruled out per Poison [place], [place], or [place] Exposure with home care treatment reviewed.    She is requesting a prescription for steroids to relieve symptoms. Declines office visit.   [facility] on [address].  Advised that I would forward request to office . PRESCRIPTION REQUEST.

## 2012-02-04 NOTE — Telephone Encounter (Signed)
.  left message to have patient return my call.  

## 2012-02-05 NOTE — Telephone Encounter (Signed)
Spoke to pt to advise results/instructions. Pt understood. Pt advised she did pick up meds and this helped her tremendously.

## 2012-03-13 ENCOUNTER — Other Ambulatory Visit: Payer: Self-pay | Admitting: Family Medicine

## 2012-03-13 MED ORDER — LISINOPRIL 10 MG PO TABS: 10.0000 mg | ORAL_TABLET | Freq: Every day | ORAL | Status: DC

## 2012-03-13 NOTE — Telephone Encounter (Signed)
rx sent to pharmacy by e-script  

## 2012-03-13 NOTE — Telephone Encounter (Signed)
refill lisinopril 10MG  tab #30 Take one tablet by mouth one time daily -- last fill 8.25.13, last ov 5.20.13 V70

## 2012-04-22 ENCOUNTER — Ambulatory Visit (INDEPENDENT_AMBULATORY_CARE_PROVIDER_SITE_OTHER): Payer: 59 | Admitting: Family Medicine

## 2012-04-22 ENCOUNTER — Encounter: Payer: Self-pay | Admitting: Family Medicine

## 2012-04-22 ENCOUNTER — Other Ambulatory Visit: Payer: Self-pay | Admitting: Physician Assistant

## 2012-04-22 VITALS — BP 110/80 | HR 83 | Temp 99.3°F | Wt 130.0 lb

## 2012-04-22 DIAGNOSIS — J209 Acute bronchitis, unspecified: Secondary | ICD-10-CM

## 2012-04-22 DIAGNOSIS — I10 Essential (primary) hypertension: Secondary | ICD-10-CM

## 2012-04-22 MED ORDER — AZITHROMYCIN 250 MG PO TABS: ORAL_TABLET | ORAL | Status: DC

## 2012-04-22 NOTE — Patient Instructions (Addendum)
Schedule your complete physical in 6 months We'll notify you of your lab results and make any changes if needed Keep up the good work!!  You look great! Start the Zpack for bronchitis Delsym as needed for cough Call with any questions or concerns Happy Holidays!!!

## 2012-04-22 NOTE — Progress Notes (Signed)
  Subjective:    Patient ID: AMEILA WELDON, female    DOB: 08-Jul-1975, 36 y.o.   MRN: [id number]  HPI HTN- chronic problem, on Lisinopril and norvasc.  Quit smoking- using a nicotine vaporizer.  No CP, SOB, HAs, visual changes, edema.  Has lost 30+ lbs.  URI- sxs started this AM w/ scratchy throat, dry cough.  No facial pain/pressure.  No ear pain.  + sick contacts.  + nasal congestion.   Review of Systems For ROS see HPI     Objective:   Physical Exam  Vitals reviewed. Constitutional: She is oriented to person, place, and time. She appears well-developed and well-nourished. No distress.  HENT:  Head: Normocephalic and atraumatic.  Eyes: Conjunctivae normal and EOM are normal. Pupils are equal, round, and reactive to light.  Neck: Normal range of motion. Neck supple. No thyromegaly present.  Cardiovascular: Normal rate, regular rhythm, normal heart sounds and intact distal pulses.   No murmur heard. Pulmonary/Chest: Effort normal and breath sounds normal. No respiratory distress.       + dry cough  Abdominal: Soft. She exhibits no distension. There is no tenderness.  Musculoskeletal: She exhibits no edema.  Lymphadenopathy:    She has no cervical adenopathy.  Neurological: She is alert and oriented to person, place, and time.  Skin: Skin is warm and dry.  Psychiatric: She has a normal mood and affect. Her behavior is normal.          Assessment & Plan:

## 2012-04-23 DIAGNOSIS — J209 Acute bronchitis, unspecified: Secondary | ICD-10-CM | POA: Insufficient documentation

## 2012-04-23 LAB — BASIC METABOLIC PANEL
Chloride: 104 mEq/L (ref 96–112)
GFR: 90.53 mL/min (ref >60.00)
Potassium: 3.8 mEq/L (ref 3.5–5.1)
Sodium: 137 mEq/L (ref 135–145)

## 2012-04-23 NOTE — Assessment & Plan Note (Signed)
New.  Start abx due to upcoming weekend and holiday.  Reviewed supportive care and red flags that should prompt return.  Pt expressed understanding and is in agreement w/ plan.

## 2012-04-23 NOTE — Assessment & Plan Note (Signed)
Chronic problem, well controlled.  Asymptomatic.  No changes. 

## 2012-06-01 ENCOUNTER — Other Ambulatory Visit: Payer: Self-pay | Admitting: *Deleted

## 2012-06-01 MED ORDER — LISINOPRIL 10 MG PO TABS: 10.0000 mg | ORAL_TABLET | Freq: Every day | ORAL | Status: DC

## 2012-06-01 MED ORDER — AMLODIPINE BESYLATE 10 MG PO TABS: 10.0000 mg | ORAL_TABLET | Freq: Every day | ORAL | Status: DC

## 2012-06-01 NOTE — Telephone Encounter (Signed)
Pt left VM that She needs refill on med and that she is currently out of med. Pt notes that pharmacy has contacted [place] several times but has yet to hear a response. Pt aware Rx has been sent to pharmacy.

## 2012-07-06 ENCOUNTER — Other Ambulatory Visit: Payer: Self-pay | Admitting: Family Medicine

## 2012-07-31 ENCOUNTER — Telehealth: Payer: Self-pay | Admitting: *Deleted

## 2012-07-31 MED ORDER — ALPRAZOLAM 0.5 MG PO TABS: ORAL_TABLET | ORAL | Status: DC

## 2012-07-31 NOTE — Telephone Encounter (Signed)
Pt left msg on triage requesting a refill for xanax 0.5mg . OK to refill? Last OV 11.20.13 Last filled 5.20.13  Rx sent to [facility] [address].

## 2012-07-31 NOTE — Telephone Encounter (Signed)
Ok for #45, 1 refill 

## 2012-07-31 NOTE — Telephone Encounter (Signed)
Rx sent 

## 2012-09-29 ENCOUNTER — Other Ambulatory Visit: Payer: Self-pay | Admitting: Obstetrics and Gynecology

## 2012-10-20 ENCOUNTER — Encounter: Payer: Self-pay | Admitting: Lab

## 2012-10-21 ENCOUNTER — Ambulatory Visit (INDEPENDENT_AMBULATORY_CARE_PROVIDER_SITE_OTHER): Payer: 59 | Admitting: Family Medicine

## 2012-10-21 ENCOUNTER — Encounter: Payer: Self-pay | Admitting: Family Medicine

## 2012-10-21 VITALS — BP 116/70 | HR 70 | Temp 97.7°F | Ht 61.25 in | Wt 130.8 lb

## 2012-10-21 DIAGNOSIS — R202 Paresthesia of skin: Secondary | ICD-10-CM

## 2012-10-21 DIAGNOSIS — Z0000 Encounter for general adult medical examination without abnormal findings: Secondary | ICD-10-CM

## 2012-10-21 DIAGNOSIS — R209 Unspecified disturbances of skin sensation: Secondary | ICD-10-CM

## 2012-10-21 DIAGNOSIS — R200 Anesthesia of skin: Secondary | ICD-10-CM

## 2012-10-21 LAB — LIPID PANEL
Total CHOL/HDL Ratio: 3
Triglycerides: 31 mg/dL (ref 0.0–149.0)

## 2012-10-21 LAB — CBC WITH DIFFERENTIAL/PLATELET
Basophils Relative: 0.5 % (ref 0.0–3.0)
Eosinophils Relative: 2.4 % (ref 0.0–5.0)
HCT: 37.9 % (ref 36.0–46.0)
Hemoglobin: 13.1 g/dL (ref 12.0–15.0)
Lymphs Abs: 2.3 10*3/uL (ref 0.7–4.0)
Monocytes Relative: 6.9 % (ref 3.0–12.0)
Neutro Abs: 5.4 10*3/uL (ref 1.4–7.7)
WBC: 8.5 10*3/uL (ref 4.5–10.5)

## 2012-10-21 LAB — BASIC METABOLIC PANEL
BUN: 17 mg/dL (ref 6–23)
CO2: 29 mEq/L (ref 19–32)
Calcium: 9.2 mg/dL (ref 8.4–10.5)
Creatinine, Ser: 0.8 mg/dL (ref 0.4–1.2)
Glucose, Bld: 79 mg/dL (ref 70–99)
Sodium: 141 mEq/L (ref 135–145)

## 2012-10-21 LAB — HEPATIC FUNCTION PANEL
Albumin: 4.2 g/dL (ref 3.5–5.2)
Alkaline Phosphatase: 46 U/L (ref 39–117)
Total Protein: 7.2 g/dL (ref 6.0–8.3)

## 2012-10-21 NOTE — Assessment & Plan Note (Signed)
Pt's PE WNL.  UTD on GYN.  Check labs.  Anticipatory guidance provided.  

## 2012-10-21 NOTE — Assessment & Plan Note (Signed)
New.  May be early carpal tunnel but since sxs started after using hand sander w/ excessive vibration, will refer to hand specialist for nerve conduction studies.  Pt expressed understanding and is in agreement w/ plan.

## 2012-10-21 NOTE — Patient Instructions (Addendum)
Follow up in 6 months to recheck BP We'll notify you of your lab results and make any changes if needed We'll call you with your hand surgery appt Keep up the good work!  You look great! Call with any questions or concerns Have a great summer!

## 2012-10-21 NOTE — Progress Notes (Signed)
  Subjective:    Patient ID: [name], female    DOB: 08-11-1975, 37 y.o.   MRN: [id number]  HPI CPE- UTD on GYN.  L hand numbness- occuring mostly in index and middle finger at the tip.  sxs started after using hand sander to refinish floors.  Was improving w/ Aleve but sxs returned when she stopped meds.  Also does a lot of typing.   Review of Systems Patient reports no vision/ hearing changes, adenopathy,fever, weight change,  persistant/recurrent hoarseness , swallowing issues, chest pain, palpitations, edema, persistant/recurrent cough, hemoptysis, dyspnea (rest/exertional/paroxysmal nocturnal), gastrointestinal bleeding (melena, rectal bleeding), abdominal pain, significant heartburn, bowel changes, GU symptoms (dysuria, hematuria, incontinence), Gyn symptoms (abnormal  bleeding, pain),  syncope, focal weakness, memory loss, skin/hair/nail changes, abnormal bruising or bleeding, anxiety, or depression.     Objective:   Physical Exam General Appearance:    Alert, cooperative, no distress, appears stated age  Head:    Normocephalic, without obvious abnormality, atraumatic  Eyes:    PERRL, conjunctiva/corneas clear, EOM's intact, fundi    benign, both eyes  Ears:    Normal TM's and external ear canals, both ears  Nose:   Nares normal, septum midline, mucosa normal, no drainage    or sinus tenderness  Throat:   Lips, mucosa, and tongue normal; teeth and gums normal  Neck:   Supple, symmetrical, trachea midline, no adenopathy;    Thyroid: no enlargement/tenderness/nodules  Back:     Symmetric, no curvature, ROM normal, no CVA tenderness  Lungs:     Clear to auscultation bilaterally, respirations unlabored  Chest Wall:    No tenderness or deformity   Heart:    Regular rate and rhythm, S1 and S2 normal, no murmur, rub   or gallop  Breast Exam:    Deferred to GYN  Abdomen:     Soft, non-tender, bowel sounds active all four quadrants,    no masses, no organomegaly  Genitalia:     Deferred to GYN  Rectal:    Extremities:   Extremities normal, atraumatic, no cyanosis or edema  Pulses:   2+ and symmetric all extremities  Skin:   Skin color, texture, turgor normal, no rashes or lesions  Lymph nodes:   Cervical, supraclavicular, and axillary nodes normal  Neurologic:   CNII-XII intact, normal strength, sensation and reflexes    throughout          Assessment & Plan:

## 2012-10-22 ENCOUNTER — Encounter: Payer: Self-pay | Admitting: General Practice

## 2012-10-25 LAB — VITAMIN D 1,25 DIHYDROXY: Vitamin D2 1, 25 (OH)2: <8 pg/mL

## 2012-10-27 ENCOUNTER — Encounter: Payer: Self-pay | Admitting: *Deleted

## 2012-11-25 ENCOUNTER — Other Ambulatory Visit: Payer: Self-pay | Admitting: Physician Assistant

## 2012-12-06 ENCOUNTER — Other Ambulatory Visit: Payer: Self-pay | Admitting: Family Medicine

## 2012-12-24 ENCOUNTER — Other Ambulatory Visit: Payer: Self-pay | Admitting: Physician Assistant

## 2013-01-21 ENCOUNTER — Other Ambulatory Visit: Payer: Self-pay | Admitting: Physician Assistant

## 2013-03-05 ENCOUNTER — Other Ambulatory Visit: Payer: Self-pay | Admitting: Family Medicine

## 2013-03-05 NOTE — Telephone Encounter (Signed)
Med filled.  

## 2013-04-23 ENCOUNTER — Other Ambulatory Visit: Payer: Self-pay | Admitting: Physician Assistant

## 2013-04-28 ENCOUNTER — Ambulatory Visit: Payer: 59 | Admitting: Family Medicine

## 2013-05-03 ENCOUNTER — Encounter: Payer: Self-pay | Admitting: Family Medicine

## 2013-05-03 ENCOUNTER — Ambulatory Visit (INDEPENDENT_AMBULATORY_CARE_PROVIDER_SITE_OTHER): Payer: 59 | Admitting: Family Medicine

## 2013-05-03 VITALS — BP 120/80 | HR 102 | Temp 98.6°F | Resp 16 | Wt 125.2 lb

## 2013-05-03 DIAGNOSIS — I10 Essential (primary) hypertension: Secondary | ICD-10-CM

## 2013-05-03 NOTE — Progress Notes (Signed)
   Subjective:    Patient ID: [name], female    DOB: 07/14/1975, 37 y.o.   MRN: [id number]  HPI Pre visit review using our clinic review tool, if applicable. No additional management support is needed unless otherwise documented below in the visit note.   HTN- chronic problem, on Lisinopril and amlodipine.  Well controlled.  Denies CP, SOB, HAs, visual changes, edema.   Review of Systems For ROS see HPI     Objective:   Physical Exam  Vitals reviewed. Constitutional: She is oriented to person, place, and time. She appears well-developed and well-nourished. No distress.  HENT:  Head: Normocephalic and atraumatic.  Eyes: Conjunctivae and EOM are normal. Pupils are equal, round, and reactive to light.  Neck: Normal range of motion. Neck supple. No thyromegaly present.  Cardiovascular: Normal rate, regular rhythm, normal heart sounds and intact distal pulses.   No murmur heard. Pulmonary/Chest: Effort normal and breath sounds normal. No respiratory distress.  Abdominal: Soft. She exhibits no distension. There is no tenderness.  Musculoskeletal: She exhibits no edema.  Lymphadenopathy:    She has no cervical adenopathy.  Neurological: She is alert and oriented to person, place, and time.  Skin: Skin is warm and dry.  Psychiatric: She has a normal mood and affect. Her behavior is normal.          Assessment & Plan:

## 2013-05-03 NOTE — Patient Instructions (Signed)
Schedule your complete physical in 6 months We'll notify you of your lab results and make any changes if needed Continue your current meds Call with any questions or concerns Happy Holidays!!

## 2013-05-04 ENCOUNTER — Encounter: Payer: Self-pay | Admitting: General Practice

## 2013-05-04 LAB — BASIC METABOLIC PANEL
Calcium: 9.1 mg/dL (ref 8.4–10.5)
Chloride: 105 mEq/L (ref 96–112)
GFR: 88.68 mL/min (ref >60.00)
Glucose, Bld: 85 mg/dL (ref 70–99)
Potassium: 3.6 mEq/L (ref 3.5–5.1)
Sodium: 137 mEq/L (ref 135–145)

## 2013-05-04 NOTE — Assessment & Plan Note (Signed)
Chronic problem.  Excellent control.  Asymptomatic.  Check labs.  No anticipated med changes. 

## 2013-06-19 ENCOUNTER — Other Ambulatory Visit: Payer: Self-pay | Admitting: Family Medicine

## 2013-06-29 ENCOUNTER — Other Ambulatory Visit: Payer: Self-pay | Admitting: Family Medicine

## 2013-06-29 NOTE — Telephone Encounter (Signed)
Med filled.  

## 2013-09-03 ENCOUNTER — Other Ambulatory Visit: Payer: Self-pay | Admitting: Family Medicine

## 2013-09-06 ENCOUNTER — Other Ambulatory Visit: Payer: Self-pay | Admitting: General Practice

## 2013-09-06 MED ORDER — FLUTICASONE PROPIONATE 50 MCG/ACT NA SUSP: NASAL | Status: DC

## 2013-09-06 NOTE — Telephone Encounter (Signed)
Med filled.  

## 2013-11-01 ENCOUNTER — Encounter: Payer: 59 | Admitting: Family Medicine

## 2013-11-23 ENCOUNTER — Other Ambulatory Visit: Payer: Self-pay | Admitting: Physician Assistant

## 2013-11-27 ENCOUNTER — Other Ambulatory Visit: Payer: Self-pay | Admitting: Family Medicine

## 2013-11-29 NOTE — Telephone Encounter (Signed)
Med filled.  

## 2013-12-01 ENCOUNTER — Other Ambulatory Visit: Payer: Self-pay | Admitting: Obstetrics and Gynecology

## 2013-12-02 LAB — CYTOLOGY - PAP

## 2013-12-07 IMAGING — CR DG KNEE COMPLETE 4+V*R*
5 series · 5 of 5 positions shown · non-contrast
Comparison: None.

CLINICAL DATA: Right knee pain.

RIGHT KNEE - COMPLETE 4+ VIEW

[view not recorded (1 of 5)]
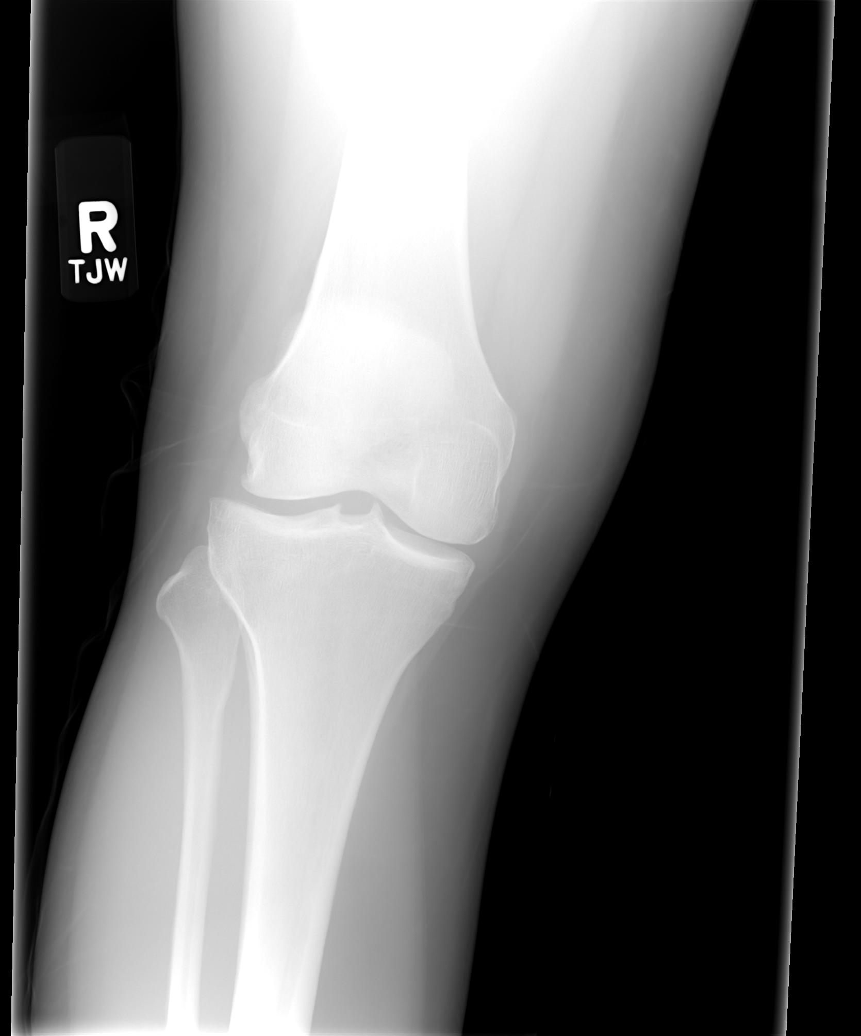

[view not recorded (2 of 5)]
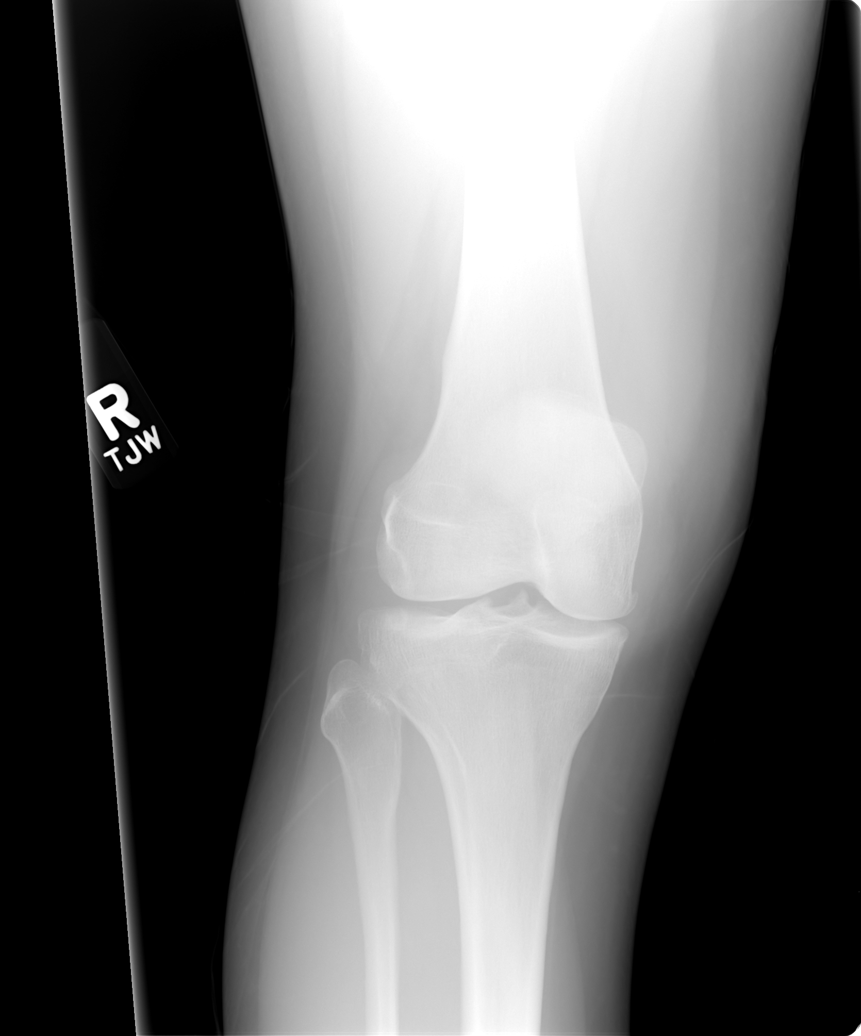

[view not recorded (3 of 5)]
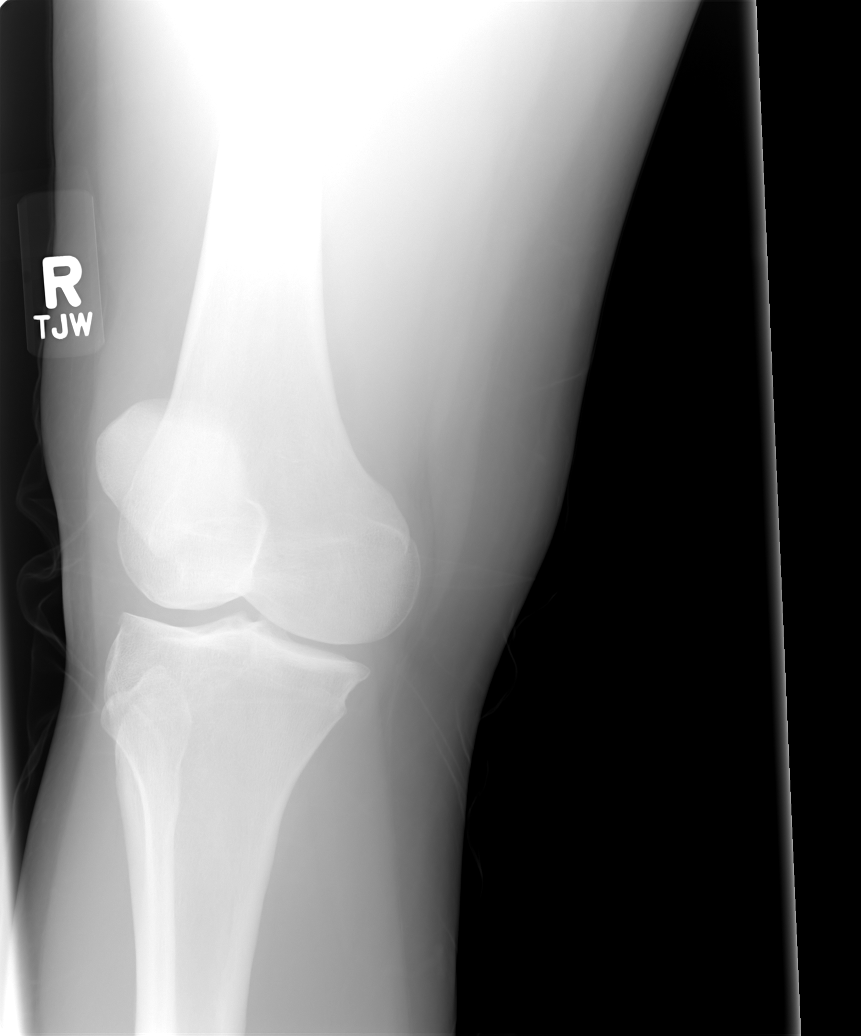

[view not recorded (4 of 5)]
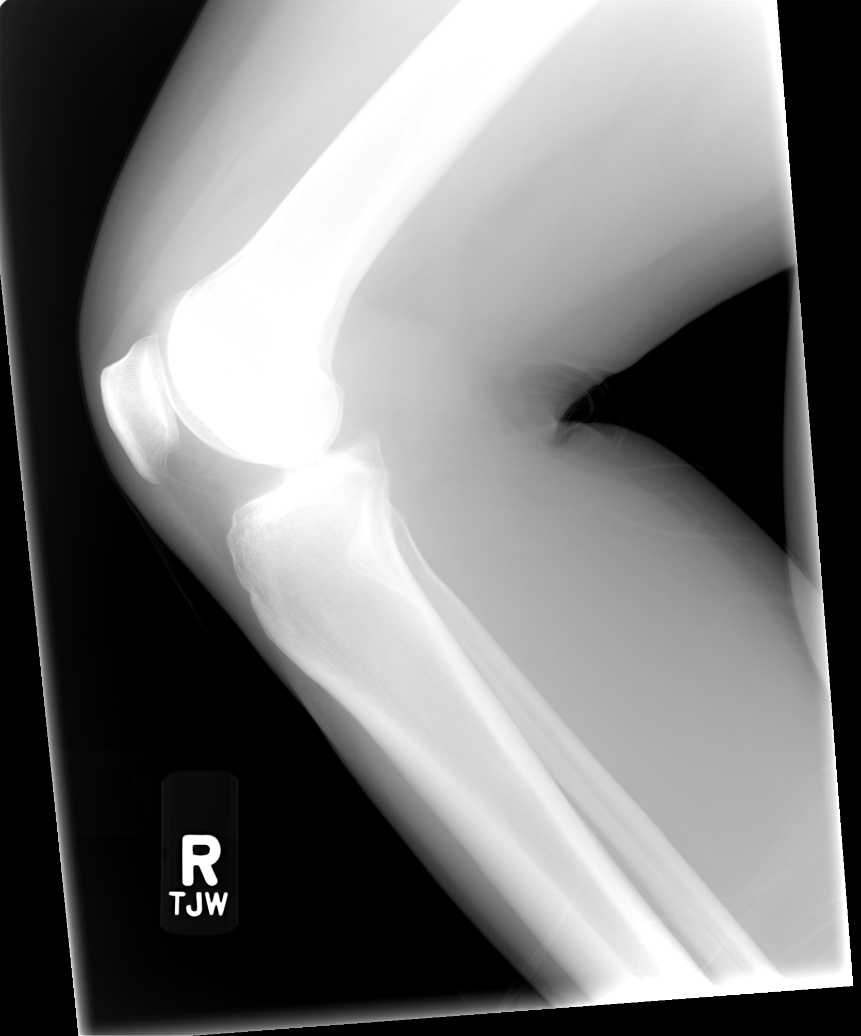

[view not recorded (5 of 5)]
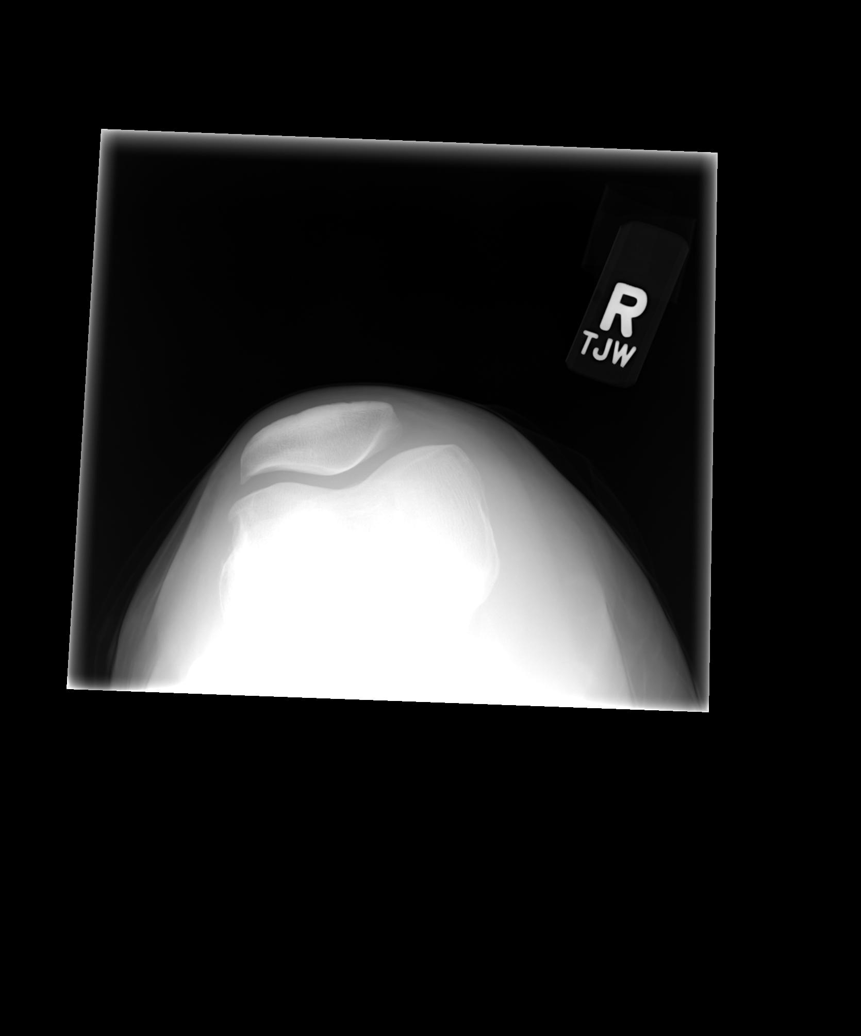

[5 of 5 positions shown; findings below may reference images not displayed]

FINDINGS: No evidence of fracture or dislocation.  No evidence of
knee joint effusion.  Mild degenerative spurring is seen involving
the medial compartment and tibial spines, without evidence of joint
space narrowing.  No other significant bone abnormality identified.
IMPRESSION: 1.  No acute findings.
2.  Mild degenerative spurring involving the medial compartment and
tibial spines.

## 2013-12-26 ENCOUNTER — Other Ambulatory Visit: Payer: Self-pay | Admitting: Family Medicine

## 2013-12-27 NOTE — Telephone Encounter (Signed)
Med filled.  

## 2014-01-13 ENCOUNTER — Encounter: Payer: Self-pay | Admitting: Family Medicine

## 2014-01-13 ENCOUNTER — Ambulatory Visit (INDEPENDENT_AMBULATORY_CARE_PROVIDER_SITE_OTHER): Payer: 59 | Admitting: Family Medicine

## 2014-01-13 VITALS — BP 130/84 | HR 87 | Temp 98.0°F | Resp 16 | Ht 61.0 in | Wt 136.0 lb

## 2014-01-13 DIAGNOSIS — Z0000 Encounter for general adult medical examination without abnormal findings: Secondary | ICD-10-CM

## 2014-01-13 NOTE — Assessment & Plan Note (Signed)
Pt's PE WNL.  UTD on GYN.  Check labs.  Anticipatory guidance provided.  

## 2014-01-13 NOTE — Progress Notes (Signed)
   Subjective:    Patient ID: [name], female    DOB: 11-21-1975, 38 y.o.   MRN: [id number]  HPI CPE- no concerns.  UTD on GYN   Review of Systems Patient reports no vision/ hearing changes, adenopathy,fever, weight change,  persistant/recurrent hoarseness , swallowing issues, chest pain, palpitations, edema, persistant/recurrent cough, hemoptysis, dyspnea (rest/exertional/paroxysmal nocturnal), gastrointestinal bleeding (melena, rectal bleeding), abdominal pain, significant heartburn, bowel changes, GU symptoms (dysuria, hematuria, incontinence), Gyn symptoms (abnormal  bleeding, pain),  syncope, focal weakness, memory loss, numbness & tingling, skin/hair/nail changes, abnormal bruising or bleeding, anxiety, or depression.     Objective:   Physical Exam General Appearance:    Alert, cooperative, no distress, appears stated age  Head:    Normocephalic, without obvious abnormality, atraumatic  Eyes:    PERRL, conjunctiva/corneas clear, EOM's intact, fundi    benign, both eyes  Ears:    Normal TM's and external ear canals, both ears  Nose:   Nares normal, septum midline, mucosa normal, no drainage    or sinus tenderness  Throat:   Lips, mucosa, and tongue normal; teeth and gums normal  Neck:   Supple, symmetrical, trachea midline, no adenopathy;    Thyroid: no enlargement/tenderness/nodules  Back:     Symmetric, no curvature, ROM normal, no CVA tenderness  Lungs:     Clear to auscultation bilaterally, respirations unlabored  Chest Wall:    No tenderness or deformity   Heart:    Regular rate and rhythm, S1 and S2 normal, no murmur, rub   or gallop  Breast Exam:    Deferred to GYN  Abdomen:     Soft, non-tender, bowel sounds active all four quadrants,    no masses, no organomegaly  Genitalia:    Deferred to GYN  Rectal:    Extremities:   Extremities normal, atraumatic, no cyanosis or edema  Pulses:   2+ and symmetric all extremities  Skin:   Skin color, texture, turgor normal,  no rashes or lesions  Lymph nodes:   Cervical, supraclavicular, and axillary nodes normal  Neurologic:   CNII-XII intact, normal strength, sensation and reflexes    throughout          Assessment & Plan:

## 2014-01-13 NOTE — Patient Instructions (Signed)
Follow up in 6 months to recheck BP We'll notify you of your lab results and make any changes if needed Keep up the good work!  You look great! Call with any questions or concerns HAPPY BIRTHDAY!!!

## 2014-01-13 NOTE — Progress Notes (Signed)
Pre visit review using our clinic review tool, if applicable. No additional management support is needed unless otherwise documented below in the visit note. 

## 2014-01-14 ENCOUNTER — Telehealth: Payer: Self-pay | Admitting: Family Medicine

## 2014-01-14 LAB — HEPATIC FUNCTION PANEL
ALBUMIN: 4.3 g/dL (ref 3.5–5.2)
ALT: 24 U/L (ref 0–35)
AST: 18 U/L (ref 0–37)
Alkaline Phosphatase: 48 U/L (ref 39–117)
Bilirubin, Direct: 0.1 mg/dL (ref 0.0–0.3)
TOTAL PROTEIN: 7.1 g/dL (ref 6.0–8.3)
Total Bilirubin: 0.4 mg/dL (ref 0.2–1.2)

## 2014-01-14 LAB — CBC WITH DIFFERENTIAL/PLATELET
BASOS PCT: 0.4 % (ref 0.0–3.0)
Basophils Absolute: 0 10*3/uL (ref 0.0–0.1)
EOS PCT: 2.4 % (ref 0.0–5.0)
Eosinophils Absolute: 0.2 10*3/uL (ref 0.0–0.7)
HEMATOCRIT: 40.3 % (ref 36.0–46.0)
HEMOGLOBIN: 13.6 g/dL (ref 12.0–15.0)
LYMPHS ABS: 2.5 10*3/uL (ref 0.7–4.0)
Lymphocytes Relative: 28.9 % (ref 12.0–46.0)
MCHC: 33.7 g/dL (ref 30.0–36.0)
MCV: 89 fl (ref 78.0–100.0)
MONOS PCT: 6.8 % (ref 3.0–12.0)
Monocytes Absolute: 0.6 10*3/uL (ref 0.1–1.0)
NEUTROS ABS: 5.3 10*3/uL (ref 1.4–7.7)
Neutrophils Relative %: 61.5 % (ref 43.0–77.0)
Platelets: 189 10*3/uL (ref 150.0–400.0)
RBC: 4.53 Mil/uL (ref 3.87–5.11)
RDW: 13 % (ref 11.5–15.5)
WBC: 8.6 10*3/uL (ref 4.0–10.5)

## 2014-01-14 LAB — BASIC METABOLIC PANEL
BUN: 17 mg/dL (ref 6–23)
CALCIUM: 9.6 mg/dL (ref 8.4–10.5)
CO2: 25 mEq/L (ref 19–32)
CREATININE: 0.9 mg/dL (ref 0.4–1.2)
Chloride: 104 mEq/L (ref 96–112)
GFR: 74.89 mL/min (ref >60.00)
GLUCOSE: 86 mg/dL (ref 70–99)
POTASSIUM: 4.3 meq/L (ref 3.5–5.1)
Sodium: 135 mEq/L (ref 135–145)

## 2014-01-14 LAB — LIPID PANEL
CHOL/HDL RATIO: 3
CHOLESTEROL: 170 mg/dL (ref 0–200)
HDL: 64.3 mg/dL (ref >39.00)
LDL CALC: 96 mg/dL (ref 0–99)
NonHDL: 105.7
TRIGLYCERIDES: 47 mg/dL (ref 0.0–149.0)
VLDL: 9.4 mg/dL (ref 0.0–40.0)

## 2014-01-14 LAB — VITAMIN D 25 HYDROXY (VIT D DEFICIENCY, FRACTURES): VITD: 44.3 ng/mL (ref 30.00–100.00)

## 2014-01-14 LAB — TSH: TSH: 0.43 u[IU]/mL (ref 0.35–4.50)

## 2014-01-14 NOTE — Telephone Encounter (Signed)
Relevant patient education mailed to patient.  

## 2014-01-25 ENCOUNTER — Other Ambulatory Visit: Payer: Self-pay | Admitting: General Practice

## 2014-01-25 MED ORDER — LISINOPRIL 10 MG PO TABS: ORAL_TABLET | ORAL | Status: DC

## 2014-01-25 MED ORDER — FLUTICASONE PROPIONATE 50 MCG/ACT NA SUSP: NASAL | Status: DC

## 2014-01-25 MED ORDER — AMLODIPINE BESYLATE 10 MG PO TABS: ORAL_TABLET | ORAL | Status: DC

## 2014-04-01 ENCOUNTER — Telehealth: Payer: Self-pay | Admitting: Family Medicine

## 2014-04-01 MED ORDER — METRONIDAZOLE 500 MG PO TABS: 500.0000 mg | ORAL_TABLET | Freq: Three times a day (TID) | ORAL | Status: DC

## 2014-04-01 NOTE — Telephone Encounter (Signed)
Med filled and pt notified.  

## 2014-04-01 NOTE — Telephone Encounter (Signed)
Ok for Flagyl 500mg  BID x7 days and if no improvement will need OV

## 2014-04-01 NOTE — Telephone Encounter (Signed)
She thinks she has a vaginal bacterial infection and she would like an rx called in to [facility] in [place].  She cannot come in today.

## 2014-04-04 ENCOUNTER — Encounter: Payer: Self-pay | Admitting: Family Medicine

## 2014-07-18 ENCOUNTER — Ambulatory Visit: Payer: 59 | Admitting: Family Medicine

## 2014-07-27 ENCOUNTER — Encounter: Payer: Self-pay | Admitting: General Practice

## 2014-07-27 ENCOUNTER — Encounter: Payer: Self-pay | Admitting: Family Medicine

## 2014-07-27 ENCOUNTER — Ambulatory Visit (INDEPENDENT_AMBULATORY_CARE_PROVIDER_SITE_OTHER): Payer: 59 | Admitting: Family Medicine

## 2014-07-27 VITALS — BP 130/86 | HR 92 | Temp 98.0°F | Resp 16 | Wt 143.1 lb

## 2014-07-27 DIAGNOSIS — N390 Urinary tract infection, site not specified: Secondary | ICD-10-CM

## 2014-07-27 DIAGNOSIS — R300 Dysuria: Secondary | ICD-10-CM

## 2014-07-27 DIAGNOSIS — I10 Essential (primary) hypertension: Secondary | ICD-10-CM

## 2014-07-27 DIAGNOSIS — R3915 Urgency of urination: Secondary | ICD-10-CM

## 2014-07-27 DIAGNOSIS — R82998 Other abnormal findings in urine: Secondary | ICD-10-CM

## 2014-07-27 DIAGNOSIS — R319 Hematuria, unspecified: Secondary | ICD-10-CM

## 2014-07-27 LAB — BASIC METABOLIC PANEL
BUN: 16 mg/dL (ref 6–23)
CO2: 27 mEq/L (ref 19–32)
Calcium: 9.3 mg/dL (ref 8.4–10.5)
Chloride: 106 mEq/L (ref 96–112)
Creatinine, Ser: 0.71 mg/dL (ref 0.40–1.20)
GFR: 98.17 mL/min (ref >60.00)
GLUCOSE: 86 mg/dL (ref 70–99)
Potassium: 3.8 mEq/L (ref 3.5–5.1)
Sodium: 138 mEq/L (ref 135–145)

## 2014-07-27 LAB — POCT URINALYSIS DIPSTICK
Bilirubin, UA: NEGATIVE
GLUCOSE UA: NEGATIVE
Ketones, UA: NEGATIVE
Nitrite, UA: NEGATIVE
Protein, UA: NEGATIVE
SPEC GRAV UA: 1.01
UROBILINOGEN UA: 0.2
pH, UA: 7

## 2014-07-27 MED ORDER — LISINOPRIL 10 MG PO TABS: ORAL_TABLET | ORAL | Status: DC

## 2014-07-27 MED ORDER — CEPHALEXIN 500 MG PO CAPS: 500.0000 mg | ORAL_CAPSULE | Freq: Two times a day (BID) | ORAL | Status: AC

## 2014-07-27 MED ORDER — AMLODIPINE BESYLATE 10 MG PO TABS: ORAL_TABLET | ORAL | Status: DC

## 2014-07-27 NOTE — Addendum Note (Signed)
Addended by: Harl Bowie on: 07/27/2014 12:17 PM   Modules accepted: Orders

## 2014-07-27 NOTE — Progress Notes (Signed)
Pre visit review using our clinic review tool, if applicable. No additional management support is needed unless otherwise documented below in the visit note. 

## 2014-07-27 NOTE — Patient Instructions (Signed)
Schedule your complete physical in 6 months Start the Keflex twice daily for the UTI Drink plenty of fluids Take both BP meds daily Call with any questions or concerns Happy Spring!!!

## 2014-07-27 NOTE — Assessment & Plan Note (Signed)
New.  Pt's sxs consistent w/ infxn.  UA indicates WBC.  Start abx.  Reviewed supportive care and red flags that should prompt return.  Pt expressed understanding and is in agreement w/ plan.

## 2014-07-27 NOTE — Assessment & Plan Note (Signed)
Chronic problem.  Higher than previous but pt has been out of 1 of her meds.  Refills provided on both meds.  Check BMP.  Reviewed supportive care and red flags that should prompt return.  Pt expressed understanding and is in agreement w/ plan.

## 2014-07-27 NOTE — Progress Notes (Signed)
   Subjective:    Patient ID: [name], female    DOB: Oct 06, 1975, 39 y.o.   MRN: [id number]  HPI HTN- chronic problem, pt has been out of 1 of her BP meds x1 week (on both Amlodipine and Lisinopril).  BP mildly elevated today.  No CP, SOB, HAs, visual changes, edema.  Dysuria- sxs started last week w/ burning, suprapubic pressure.  No fevers.  No CVA tenderness.  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  + frequency.  + urgency.   Review of Systems For ROS see HPI     Objective:   Physical Exam  Constitutional: She is oriented to person, place, and time. She appears well-developed and well-nourished. No distress.  HENT:  Head: Normocephalic and atraumatic.  Eyes: Conjunctivae and EOM are normal. Pupils are equal, round, and reactive to light.  Neck: Normal range of motion. Neck supple. No thyromegaly present.  Cardiovascular: Normal rate, regular rhythm, normal heart sounds and intact distal pulses.   No murmur heard. Pulmonary/Chest: Effort normal and breath sounds normal. No respiratory distress.  Abdominal: Soft. She exhibits no distension. There is tenderness (mild suprapubic tenderness, no CVA tenderness).  Musculoskeletal: She exhibits no edema.  Lymphadenopathy:    She has no cervical adenopathy.  Neurological: She is alert and oriented to person, place, and time.  Skin: Skin is warm and dry.  Psychiatric: She has a normal mood and affect. Her behavior is normal.  Vitals reviewed.         Assessment & Plan:

## 2014-07-29 LAB — URINE CULTURE

## 2014-10-12 ENCOUNTER — Other Ambulatory Visit: Payer: Self-pay | Admitting: General Practice

## 2014-10-12 MED ORDER — FLUTICASONE PROPIONATE 50 MCG/ACT NA SUSP: NASAL | Status: DC

## 2014-12-13 ENCOUNTER — Telehealth: Payer: Self-pay | Admitting: Family Medicine

## 2014-12-13 NOTE — Telephone Encounter (Signed)
Called pt and gave her the number to [place] as notified by provider. Pt stated an understanding.

## 2014-12-13 NOTE — Telephone Encounter (Signed)
Caller name: [name] Relationship to patient: self Can be reached: [phone]  Reason for call: Pt called in asking for referral to counseling. 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 I offered her appt with Dr. Birdie Riddle but she said she thinks she just needs a referral as soon as she can to get in and talk to someone. Pt was in tears during call.

## 2014-12-13 NOTE — Telephone Encounter (Signed)
Please advise, is referral needed or does pt just need a number to call?

## 2014-12-13 NOTE — Telephone Encounter (Signed)
Ok to provide #s for [place] pt lives in [place] area and we have multiple sites

## 2014-12-20 ENCOUNTER — Ambulatory Visit (INDEPENDENT_AMBULATORY_CARE_PROVIDER_SITE_OTHER): Payer: 59 | Admitting: Psychology

## 2014-12-20 DIAGNOSIS — F4322 Adjustment disorder with anxiety: Secondary | ICD-10-CM | POA: Diagnosis not present

## 2015-01-04 ENCOUNTER — Ambulatory Visit (INDEPENDENT_AMBULATORY_CARE_PROVIDER_SITE_OTHER): Payer: 59 | Admitting: Psychology

## 2015-01-04 DIAGNOSIS — F4322 Adjustment disorder with anxiety: Secondary | ICD-10-CM | POA: Diagnosis not present

## 2015-01-19 ENCOUNTER — Telehealth: Payer: Self-pay | Admitting: Family Medicine

## 2015-01-19 NOTE — Telephone Encounter (Signed)
pre visit letter mailed 01/06/15 °

## 2015-01-25 ENCOUNTER — Telehealth: Payer: Self-pay

## 2015-01-25 NOTE — Telephone Encounter (Signed)
Pt has to reschedule

## 2015-01-27 ENCOUNTER — Encounter: Payer: 59 | Admitting: Family Medicine

## 2015-01-30 ENCOUNTER — Other Ambulatory Visit: Payer: Self-pay | Admitting: Family Medicine

## 2015-01-31 ENCOUNTER — Other Ambulatory Visit: Payer: Self-pay | Admitting: Obstetrics and Gynecology

## 2015-01-31 NOTE — Telephone Encounter (Signed)
Medication filled to pharmacy as requested.   

## 2015-02-01 LAB — CYTOLOGY - PAP

## 2015-02-09 ENCOUNTER — Other Ambulatory Visit: Payer: Self-pay | Admitting: Family Medicine

## 2015-02-09 NOTE — Telephone Encounter (Signed)
Medication filled to pharmacy as requested.   

## 2015-02-26 ENCOUNTER — Other Ambulatory Visit: Payer: Self-pay | Admitting: Family Medicine

## 2015-02-27 NOTE — Telephone Encounter (Signed)
Medication filled to pharmacy as requested.   

## 2015-04-02 ENCOUNTER — Other Ambulatory Visit: Payer: Self-pay | Admitting: Family Medicine

## 2015-04-03 NOTE — Telephone Encounter (Signed)
Medication filled to pharmacy as requested.   

## 2015-04-28 ENCOUNTER — Other Ambulatory Visit: Payer: Self-pay | Admitting: Family Medicine

## 2015-06-13 ENCOUNTER — Other Ambulatory Visit: Payer: Self-pay | Admitting: Obstetrics and Gynecology

## 2015-06-22 ENCOUNTER — Ambulatory Visit (INDEPENDENT_AMBULATORY_CARE_PROVIDER_SITE_OTHER): Payer: 59 | Admitting: Family Medicine

## 2015-06-22 ENCOUNTER — Encounter: Payer: Self-pay | Admitting: Family Medicine

## 2015-06-22 VITALS — BP 123/81 | HR 78 | Temp 98.4°F | Ht 61.0 in | Wt 147.6 lb

## 2015-06-22 DIAGNOSIS — Z0000 Encounter for general adult medical examination without abnormal findings: Secondary | ICD-10-CM | POA: Diagnosis not present

## 2015-06-22 NOTE — Progress Notes (Signed)
   Subjective:    Patient ID: [name], female    DOB: Aug 16, 1975, 40 y.o.   MRN: [id number]  HPI CPE- UTD on pap. Quit cigarettes, lowest nicotine vape.  S/p uterine ablation.   Review of Systems Patient reports no vision/ hearing changes, adenopathy,fever, weight change,  persistant/recurrent hoarseness , swallowing issues, chest pain, palpitations, edema, persistant/recurrent cough, hemoptysis, dyspnea (rest/exertional/paroxysmal nocturnal), gastrointestinal bleeding (melena, rectal bleeding), abdominal pain, significant heartburn, bowel changes, GU symptoms (dysuria, hematuria, incontinence), Gyn symptoms (abnormal  bleeding, pain),  syncope, focal weakness, memory loss, numbness & tingling, skin/hair/nail changes, abnormal bruising or bleeding, anxiety, or depression.     Objective:   Physical Exam General Appearance:    Alert, cooperative, no distress, appears stated age  Head:    Normocephalic, without obvious abnormality, atraumatic  Eyes:    PERRL, conjunctiva/corneas clear, EOM's intact, fundi    benign, both eyes  Ears:    Normal TM's and external ear canals, both ears  Nose:   Nares normal, septum midline, mucosa normal, no drainage    or sinus tenderness  Throat:   Lips, mucosa, and tongue normal; teeth and gums normal  Neck:   Supple, symmetrical, trachea midline, no adenopathy;    Thyroid: no enlargement/tenderness/nodules  Back:     Symmetric, no curvature, ROM normal, no CVA tenderness  Lungs:     Clear to auscultation bilaterally, respirations unlabored  Chest Wall:    No tenderness or deformity   Heart:    Regular rate and rhythm, S1 and S2 normal, no murmur, rub   or gallop  Breast Exam:    Deferred to GYN  Abdomen:     Soft, non-tender, bowel sounds active all four quadrants,    no masses, no organomegaly  Genitalia:    Deferred to GYN  Rectal:    Extremities:   Extremities normal, atraumatic, no cyanosis or edema  Pulses:   2+ and symmetric all  extremities  Skin:   Skin color, texture, turgor normal, no rashes or lesions  Lymph nodes:   Cervical, supraclavicular, and axillary nodes normal  Neurologic:   CNII-XII intact, normal strength, sensation and reflexes    throughout          Assessment & Plan:

## 2015-06-22 NOTE — Patient Instructions (Signed)
Follow up in 6 months to recheck BP We'll notify you of your lab results and make any changes if needed Keep up the good work on healthy diet and regular exercise- you look great! Congrats on quitting smoking!!  So proud of you!!! Call with any questions or concerns If you want to join [place] at the new [place] office, any scheduled appointments will automatically transfer and we will see you at [address] [place] [address], [place], [place] [address] ([place]) [place]!!!

## 2015-06-22 NOTE — Assessment & Plan Note (Signed)
Pt's PE WNL.  UTD on GYN.  Pt declines flu shot.  Check labs.  Anticipatory guidance provided.

## 2015-06-22 NOTE — Progress Notes (Signed)
Pre visit review using our clinic review tool, if applicable. No additional management support is needed unless otherwise documented below in the visit note. 

## 2015-06-23 LAB — BASIC METABOLIC PANEL
BUN: 12 mg/dL (ref 6–23)
CHLORIDE: 103 meq/L (ref 96–112)
CO2: 26 meq/L (ref 19–32)
Calcium: 9.5 mg/dL (ref 8.4–10.5)
Creatinine, Ser: 0.82 mg/dL (ref 0.40–1.20)
GFR: 82.74 mL/min (ref >60.00)
GLUCOSE: 74 mg/dL (ref 70–99)
POTASSIUM: 4.1 meq/L (ref 3.5–5.1)
Sodium: 139 mEq/L (ref 135–145)

## 2015-06-23 LAB — CBC WITH DIFFERENTIAL/PLATELET
BASOS ABS: 0 10*3/uL (ref 0.0–0.1)
Basophils Relative: 0.5 % (ref 0.0–3.0)
Eosinophils Absolute: 0.1 10*3/uL (ref 0.0–0.7)
Eosinophils Relative: 1.2 % (ref 0.0–5.0)
HCT: 38.9 % (ref 36.0–46.0)
Hemoglobin: 12.9 g/dL (ref 12.0–15.0)
LYMPHS ABS: 2.1 10*3/uL (ref 0.7–4.0)
Lymphocytes Relative: 24.2 % (ref 12.0–46.0)
MCHC: 33.2 g/dL (ref 30.0–36.0)
MCV: 87.8 fl (ref 78.0–100.0)
MONO ABS: 0.7 10*3/uL (ref 0.1–1.0)
Monocytes Relative: 7.6 % (ref 3.0–12.0)
NEUTROS ABS: 5.7 10*3/uL (ref 1.4–7.7)
NEUTROS PCT: 66.5 % (ref 43.0–77.0)
PLATELETS: 252 10*3/uL (ref 150.0–400.0)
RBC: 4.43 Mil/uL (ref 3.87–5.11)
RDW: 12.8 % (ref 11.5–15.5)
WBC: 8.6 10*3/uL (ref 4.0–10.5)

## 2015-06-23 LAB — LIPID PANEL
CHOLESTEROL: 167 mg/dL (ref 0–200)
HDL: 58.5 mg/dL (ref >39.00)
LDL Cholesterol: 100 mg/dL — ABNORMAL HIGH (ref 0–99)
NonHDL: 108.79
TRIGLYCERIDES: 45 mg/dL (ref 0.0–149.0)
Total CHOL/HDL Ratio: 3
VLDL: 9 mg/dL (ref 0.0–40.0)

## 2015-06-23 LAB — HEPATIC FUNCTION PANEL
ALBUMIN: 4.5 g/dL (ref 3.5–5.2)
ALK PHOS: 60 U/L (ref 39–117)
ALT: 15 U/L (ref 0–35)
AST: 19 U/L (ref 0–37)
Bilirubin, Direct: 0.1 mg/dL (ref 0.0–0.3)
Total Bilirubin: 0.5 mg/dL (ref 0.2–1.2)
Total Protein: 7.4 g/dL (ref 6.0–8.3)

## 2015-06-23 LAB — VITAMIN D 25 HYDROXY (VIT D DEFICIENCY, FRACTURES): VITD: 20.2 ng/mL — ABNORMAL LOW (ref 30.00–100.00)

## 2015-06-23 LAB — TSH: TSH: 0.47 u[IU]/mL (ref 0.35–4.50)

## 2015-06-23 MED ORDER — VITAMIN D (ERGOCALCIFEROL) 1.25 MG (50000 UNIT) PO CAPS: 50000.0000 [IU] | ORAL_CAPSULE | ORAL | Status: DC

## 2015-06-23 NOTE — Addendum Note (Signed)
Addended by: Dorrene German on: 06/23/2015 04:22 PM   Modules accepted: Orders, SmartSet

## 2015-06-27 ENCOUNTER — Telehealth: Payer: Self-pay | Admitting: Family Medicine

## 2015-06-27 NOTE — Telephone Encounter (Signed)
Caller name: [place]   Relationship to patient: Self  Can be reached: [phone]  Pharmacy: [place], [place]  Reason for call:Pt says that she is having symptoms of a UTI. She would like to know if PCP is willing to call in her a Rx. For it. She says that she was just here for her CPE and is unable to take more time off work to come in to another appt.  Please advise.

## 2015-06-28 ENCOUNTER — Other Ambulatory Visit: Payer: Self-pay | Admitting: Family Medicine

## 2015-06-28 MED ORDER — CEPHALEXIN 500 MG PO CAPS: 500.0000 mg | ORAL_CAPSULE | Freq: Two times a day (BID) | ORAL | Status: DC

## 2015-06-28 NOTE — Telephone Encounter (Signed)
Ok for Keflex 500mg  BID x5 days, #10.  If no improvement, will need OV

## 2015-06-28 NOTE — Telephone Encounter (Signed)
Medication called into pharmacy.  °Patient notified.  °

## 2015-08-06 ENCOUNTER — Other Ambulatory Visit: Payer: Self-pay | Admitting: Family Medicine

## 2015-08-07 ENCOUNTER — Other Ambulatory Visit: Payer: Self-pay | Admitting: Family Medicine

## 2015-08-07 NOTE — Telephone Encounter (Signed)
Medication filled to pharmacy as requested.   

## 2015-09-26 ENCOUNTER — Other Ambulatory Visit: Payer: Self-pay | Admitting: General Practice

## 2015-09-26 MED ORDER — LISINOPRIL 10 MG PO TABS: ORAL_TABLET | ORAL | Status: DC

## 2015-12-21 ENCOUNTER — Encounter: Payer: Self-pay | Admitting: Family Medicine

## 2015-12-21 ENCOUNTER — Encounter: Payer: Self-pay | Admitting: General Practice

## 2015-12-21 ENCOUNTER — Ambulatory Visit (INDEPENDENT_AMBULATORY_CARE_PROVIDER_SITE_OTHER): Payer: 59 | Admitting: Family Medicine

## 2015-12-21 ENCOUNTER — Other Ambulatory Visit (INDEPENDENT_AMBULATORY_CARE_PROVIDER_SITE_OTHER): Payer: 59

## 2015-12-21 VITALS — BP 111/76 | HR 84 | Temp 98.0°F | Resp 16 | Ht 61.0 in | Wt 141.2 lb

## 2015-12-21 DIAGNOSIS — I10 Essential (primary) hypertension: Secondary | ICD-10-CM

## 2015-12-21 LAB — CBC WITH DIFFERENTIAL/PLATELET
BASOS ABS: 0 10*3/uL (ref 0.0–0.1)
Basophils Relative: 0.3 % (ref 0.0–3.0)
Eosinophils Absolute: 0.2 10*3/uL (ref 0.0–0.7)
Eosinophils Relative: 1.9 % (ref 0.0–5.0)
HEMATOCRIT: 38.6 % (ref 36.0–46.0)
Hemoglobin: 12.9 g/dL (ref 12.0–15.0)
LYMPHS PCT: 30.1 % (ref 12.0–46.0)
Lymphs Abs: 2.4 10*3/uL (ref 0.7–4.0)
MCHC: 33.4 g/dL (ref 30.0–36.0)
MCV: 87.5 fl (ref 78.0–100.0)
MONOS PCT: 4.8 % (ref 3.0–12.0)
Monocytes Absolute: 0.4 10*3/uL (ref 0.1–1.0)
NEUTROS ABS: 5.1 10*3/uL (ref 1.4–7.7)
Neutrophils Relative %: 62.9 % (ref 43.0–77.0)
PLATELETS: 234 10*3/uL (ref 150.0–400.0)
RBC: 4.41 Mil/uL (ref 3.87–5.11)
RDW: 13.5 % (ref 11.5–15.5)
WBC: 8.1 10*3/uL (ref 4.0–10.5)

## 2015-12-21 LAB — BASIC METABOLIC PANEL
BUN: 14 mg/dL (ref 6–23)
CALCIUM: 9.3 mg/dL (ref 8.4–10.5)
CO2: 27 meq/L (ref 19–32)
Chloride: 105 mEq/L (ref 96–112)
Creatinine, Ser: 0.93 mg/dL (ref 0.40–1.20)
GFR: 71.36 mL/min (ref >60.00)
Glucose, Bld: 114 mg/dL — ABNORMAL HIGH (ref 70–99)
Potassium: 4 mEq/L (ref 3.5–5.1)
SODIUM: 140 meq/L (ref 135–145)

## 2015-12-21 NOTE — Patient Instructions (Signed)
Schedule your complete physical in 6 months Schedule a lab visit at your convenience- you can go today on the way out Keep up the good work on healthy diet and regular exercise- you look great!! Call with any questions or concerns Have a great summer!!!

## 2015-12-21 NOTE — Progress Notes (Signed)
   Subjective:    Patient ID: [name], female    DOB: [birth year], 40 y.o.   MRN: [id number]  HPI HTN- chronic problem.  Well controlled on Lisinopril and Amlodipine.  Pt has lost 7 lbs since last visit.  Pt is exercising regularly.  Denies CP, SOB, HAs, visual changes, edema.    Review of Systems For ROS see HPI     Objective:   Physical Exam  Constitutional: She is oriented to person, place, and time. She appears well-developed and well-nourished. No distress.  HENT:  Head: Normocephalic and atraumatic.  Eyes: Conjunctivae and EOM are normal. Pupils are equal, round, and reactive to light.  Neck: Normal range of motion. Neck supple. No thyromegaly present.  Cardiovascular: Normal rate, regular rhythm, normal heart sounds and intact distal pulses.   No murmur heard. Pulmonary/Chest: Effort normal and breath sounds normal. No respiratory distress.  Abdominal: Soft. She exhibits no distension. There is no tenderness.  Musculoskeletal: She exhibits no edema.  Lymphadenopathy:    She has no cervical adenopathy.  Neurological: She is alert and oriented to person, place, and time.  Skin: Skin is warm and dry.  Psychiatric: She has a normal mood and affect. Her behavior is normal.  Vitals reviewed.         Assessment & Plan:

## 2015-12-21 NOTE — Assessment & Plan Note (Signed)
Excellent control.  Asymptomatic.  Check labs.  No anticipated med changes.  Will follow. 

## 2015-12-21 NOTE — Progress Notes (Signed)
Pre visit review using our clinic review tool, if applicable. No additional management support is needed unless otherwise documented below in the visit note. 

## 2016-01-08 ENCOUNTER — Other Ambulatory Visit: Payer: Self-pay | Admitting: Family Medicine

## 2016-01-30 ENCOUNTER — Other Ambulatory Visit: Payer: Self-pay | Admitting: General Practice

## 2016-01-30 MED ORDER — LISINOPRIL 10 MG PO TABS: ORAL_TABLET | ORAL | 1 refills | Status: DC

## 2016-02-17 ENCOUNTER — Other Ambulatory Visit: Payer: Self-pay | Admitting: Family Medicine

## 2016-05-18 ENCOUNTER — Other Ambulatory Visit: Payer: Self-pay | Admitting: Family Medicine

## 2016-06-26 ENCOUNTER — Encounter: Payer: Self-pay | Admitting: Family Medicine

## 2016-07-17 ENCOUNTER — Other Ambulatory Visit: Payer: Self-pay | Admitting: Family Medicine

## 2016-08-31 ENCOUNTER — Other Ambulatory Visit: Payer: Self-pay | Admitting: Family Medicine

## 2016-09-09 ENCOUNTER — Telehealth: Payer: Self-pay | Admitting: Family Medicine

## 2016-09-09 MED ORDER — LISINOPRIL 10 MG PO TABS: ORAL_TABLET | ORAL | 1 refills | Status: DC

## 2016-09-09 MED ORDER — AMLODIPINE BESYLATE 10 MG PO TABS: ORAL_TABLET | ORAL | 1 refills | Status: DC

## 2016-09-09 NOTE — Telephone Encounter (Signed)
Dr Glori Bickers, Diona Browner are both female Dr Danise Mina if she is willing to see a female

## 2016-09-09 NOTE — Telephone Encounter (Signed)
Patient calling to request:  1.  Refill of amLODipine (NORVASC) 10 MG tablet & lisinopril (PRINIVIL,ZESTRIL) 10 MG tablet [facility]: [facility] [address] [place], [place] [phone] (Phone) [phone] (Fax)   2.  Recommendation from Dr. Birdie Riddle of a doctor she can see at [facility].  Patient now lives in [place] and would like to see someone at that office.

## 2016-09-09 NOTE — Telephone Encounter (Signed)
I filled these medications for pt, please advise on the provider with [facility]

## 2016-09-10 NOTE — Telephone Encounter (Signed)
Called pt and left a detailed message to inform pt

## 2016-10-14 ENCOUNTER — Ambulatory Visit (INDEPENDENT_AMBULATORY_CARE_PROVIDER_SITE_OTHER): Payer: BLUE CROSS/BLUE SHIELD | Admitting: Internal Medicine

## 2016-10-14 ENCOUNTER — Encounter: Payer: Self-pay | Admitting: Internal Medicine

## 2016-10-14 DIAGNOSIS — I10 Essential (primary) hypertension: Secondary | ICD-10-CM | POA: Diagnosis not present

## 2016-10-14 DIAGNOSIS — F329 Major depressive disorder, single episode, unspecified: Secondary | ICD-10-CM | POA: Diagnosis not present

## 2016-10-14 DIAGNOSIS — J302 Other seasonal allergic rhinitis: Secondary | ICD-10-CM | POA: Insufficient documentation

## 2016-10-14 DIAGNOSIS — F32A Depression, unspecified: Secondary | ICD-10-CM | POA: Insufficient documentation

## 2016-10-14 DIAGNOSIS — J301 Allergic rhinitis due to pollen: Secondary | ICD-10-CM

## 2016-10-14 NOTE — Progress Notes (Signed)
HPI  Pt presents to the clinic today to establish care and for management of the conditions listed below. She is transferring care from Dr. Beverely Low.  Depression: She reports this is currently not an issue. She denies anxiety, SI/HI.  HTN:  Her BP today is 124/76. She is prescribed Amlodipine and Lisinopril but has been out of it for the last 3 days.   Seasonal Allergies: Worse in the spring and fall. She takes Flonase daily with good relief.  Flu: never Tetanus: > 10 years Pap Smear:  06/2016 at [facility] Dentist: biannually   Past Medical History:  Diagnosis Date  . Depression   . Hypertension     Current Outpatient Prescriptions  Medication Sig Dispense Refill  . amLODipine (NORVASC) 10 MG tablet TAKE 1 TABLET BY MOUTH ONE TIME DAILY 30 tablet 1  . fluticasone (FLONASE) 50 MCG/ACT nasal spray SPRAY TWICE INTO EACH NOSTRIL ONCE DAILY 16 g 3  . lisinopril (PRINIVIL,ZESTRIL) 10 MG tablet TAKE 1 TABLET BY MOUTH ONE TIME DAILY. 30 tablet 1  . loratadine (CLARITIN) 10 MG tablet Take 10 mg by mouth as needed.      . naproxen sodium (ANAPROX) 220 MG tablet Take 220 mg by mouth 2 (two) times daily with a meal. Reported on 06/22/2015     No current facility-administered medications for this visit.     Allergies  Allergen Reactions  . Epinephrine Other (See Comments)    Severe migraine the next day  . Morphine     REACTION: itching    Family History  Problem Relation Age of Onset  . Hypertension Mother   . Hypertension Father   . Diabetes Unknown        grandmother  . Diabetes Mother   . Stroke Mother 77       x2  . Colon cancer Paternal Grandfather        dx after age 21    Social History   Social History  . Marital status: Married    Spouse name: N/A  . Number of children: N/A  . Years of education: N/A   Occupational History  . Not on file.   Social History Main Topics  . Smoking status: Current Every Day Smoker    Last attempt to quit: 04/19/2015  .  Smokeless tobacco: Never Used     Comment: 6 cigs per day  . Alcohol use 0.0 oz/week     Comment: occ.  . Drug use: No  . Sexual activity: Yes   Other Topics Concern  . Not on file   Social History Narrative   Sole income for household of 4, husband lost his job.    Husband previously had affair.    ROS:  Constitutional: Denies fever, malaise, fatigue, headache or abrupt weight changes.  Respiratory: Denies difficulty breathing, shortness of breath, cough or sputum production.   Cardiovascular: Denies chest pain, chest tightness, palpitations or swelling in the hands or feet.  Neurological: Denies dizziness, difficulty with memory, difficulty with speech or problems with balance and coordination.  Psych: Denies anxiety, depression, SI/HI.  No other specific complaints in a complete review of systems (except as listed in HPI above).  PE:  BP 124/76   Pulse 80   Temp 98.4 F (36.9 C) (Oral)   Ht 5\' 1"  (1.549 m)   Wt 139 lb 12 oz (63.4 kg)   SpO2 98%   BMI 26.41 kg/m  Wt Readings from Last 3 Encounters:  10/14/16 139 lb  12 oz (63.4 kg)  12/21/15 141 lb 4 oz (64.1 kg)  06/22/15 147 lb 9.6 oz (67 kg)    General: Appears her stated age, well developed, well nourished in NAD. Cardiovascular: Normal rate and rhythm.  Pulmonary/Chest: Normal effort and positive vesicular breath sounds. No respiratory distress. No wheezes, rales or ronchi noted.  Neurological: Alert and oriented. Marland Kitchen  Psychiatric: Mood and affect normal. Behavior is normal. Judgment and thought content normal.    BMET    Component Value Date/Time   NA 140 12/21/2015 1457   K 4.0 12/21/2015 1457   CL 105 12/21/2015 1457   CO2 27 12/21/2015 1457   GLUCOSE 114 (H) 12/21/2015 1457   GLUCOSE 77 09/13/2008   BUN 14 12/21/2015 1457   CREATININE 0.93 12/21/2015 1457   CALCIUM 9.3 12/21/2015 1457   GFRNONAA 87.98 09/21/2009 0907   GFRAA  06/09/2008 1020    >60        The eGFR has been calculated using the  MDRD equation. This calculation has not been validated in all clinical situations. eGFR's persistently <60 mL/min signify possible Chronic Kidney Disease.    Lipid Panel     Component Value Date/Time   CHOL 167 06/22/2015 1552   TRIG 45.0 06/22/2015 1552   HDL 58.50 06/22/2015 1552   CHOLHDL 3 06/22/2015 1552   VLDL 9.0 06/22/2015 1552   LDLCALC 100 (H) 06/22/2015 1552    CBC    Component Value Date/Time   WBC 8.1 12/21/2015 1457   RBC 4.41 12/21/2015 1457   HGB 12.9 12/21/2015 1457   HCT 38.6 12/21/2015 1457   PLT 234.0 12/21/2015 1457   PLT 220 09/13/2008   MCV 87.5 12/21/2015 1457   MCH 34.3 09/13/2008   MCHC 33.4 12/21/2015 1457   RDW 13.5 12/21/2015 1457   LYMPHSABS 2.4 12/21/2015 1457   MONOABS 0.4 12/21/2015 1457   EOSABS 0.2 12/21/2015 1457   BASOSABS 0.0 12/21/2015 1457    Hgb A1C No results found for: HGBA1C   Assessment and Plan:

## 2016-10-14 NOTE — Assessment & Plan Note (Signed)
Currently not an issue Will monitor 

## 2016-10-14 NOTE — Patient Instructions (Signed)
Hypertension °Hypertension is another name for high blood pressure. High blood pressure forces your heart to work harder to pump blood. This can cause problems over time. °There are two numbers in a blood pressure reading. There is a top number (systolic) over a bottom number (diastolic). It is best to have a blood pressure below 120/80. Healthy choices can help lower your blood pressure. You may need medicine to help lower your blood pressure if: °· Your blood pressure cannot be lowered with healthy choices. °· Your blood pressure is higher than 130/80. °Follow these instructions at home: °Eating and drinking  °· If directed, follow the DASH eating plan. This diet includes: °¨ Filling half of your plate at each meal with fruits and vegetables. °¨ Filling one quarter of your plate at each meal with whole grains. Whole grains include whole wheat pasta, brown rice, and whole grain bread. °¨ Eating or drinking low-fat dairy products, such as skim milk or low-fat yogurt. °¨ Filling one quarter of your plate at each meal with low-fat (lean) proteins. Low-fat proteins include fish, skinless chicken, eggs, beans, and tofu. °¨ Avoiding fatty meat, cured and processed meat, or chicken with skin. °¨ Avoiding premade or processed food. °· Eat less than 1,500 mg of salt (sodium) a day. °· Limit alcohol use to no more than 1 drink a day for nonpregnant women and 2 drinks a day for men. One drink equals 12 oz of beer, 5 oz of wine, or 1½ oz of hard liquor. °Lifestyle  °· Work with your doctor to stay at a healthy weight or to lose weight. Ask your doctor what the best weight is for you. °· Get at least 30 minutes of exercise that causes your heart to beat faster (aerobic exercise) most days of the week. This may include walking, swimming, or biking. °· Get at least 30 minutes of exercise that strengthens your muscles (resistance exercise) at least 3 days a week. This may include lifting weights or pilates. °· Do not use any  products that contain nicotine or tobacco. This includes cigarettes and e-cigarettes. If you need help quitting, ask your doctor. °· Check your blood pressure at home as told by your doctor. °· Keep all follow-up visits as told by your doctor. This is important. °Medicines  °· Take over-the-counter and prescription medicines only as told by your doctor. Follow directions carefully. °· Do not skip doses of blood pressure medicine. The medicine does not work as well if you skip doses. Skipping doses also puts you at risk for problems. °· Ask your doctor about side effects or reactions to medicines that you should watch for. °Contact a doctor if: °· You think you are having a reaction to the medicine you are taking. °· You have headaches that keep coming back (recurring). °· You feel dizzy. °· You have swelling in your ankles. °· You have trouble with your vision. °Get help right away if: °· You get a very bad headache. °· You start to feel confused. °· You feel weak or numb. °· You feel faint. °· You get very bad pain in your: °¨ Chest. °¨ Belly (abdomen). °· You throw up (vomit) more than once. °· You have trouble breathing. °Summary °· Hypertension is another name for high blood pressure. °· Making healthy choices can help lower blood pressure. If your blood pressure cannot be controlled with healthy choices, you may need to take medicine. °This information is not intended to replace advice given to you by your   health care provider. Make sure you discuss any questions you have with your health care provider. °Document Released: 11/06/2007 Document Revised: 04/17/2016 Document Reviewed: 04/17/2016 °Elsevier Interactive Patient Education © 2017 [employer] Inc. ° °

## 2016-10-14 NOTE — Assessment & Plan Note (Signed)
Continue Flonase daily Can add antihistamine OTC if needed

## 2016-10-14 NOTE — Assessment & Plan Note (Signed)
Controlled off meds Will continue to hold for now  RTC in 2 weeks for BP check

## 2016-10-29 ENCOUNTER — Encounter: Payer: Self-pay | Admitting: Internal Medicine

## 2016-10-29 ENCOUNTER — Ambulatory Visit (INDEPENDENT_AMBULATORY_CARE_PROVIDER_SITE_OTHER): Payer: BLUE CROSS/BLUE SHIELD | Admitting: Internal Medicine

## 2016-10-29 DIAGNOSIS — I10 Essential (primary) hypertension: Secondary | ICD-10-CM | POA: Diagnosis not present

## 2016-10-29 MED ORDER — AMLODIPINE BESYLATE 10 MG PO TABS: ORAL_TABLET | ORAL | 5 refills | Status: DC

## 2016-10-29 NOTE — Patient Instructions (Signed)
Hypertension °Hypertension is another name for high blood pressure. High blood pressure forces your heart to work harder to pump blood. This can cause problems over time. °There are two numbers in a blood pressure reading. There is a top number (systolic) over a bottom number (diastolic). It is best to have a blood pressure below 120/80. Healthy choices can help lower your blood pressure. You may need medicine to help lower your blood pressure if: °· Your blood pressure cannot be lowered with healthy choices. °· Your blood pressure is higher than 130/80. °Follow these instructions at home: °Eating and drinking  °· If directed, follow the DASH eating plan. This diet includes: °¨ Filling half of your plate at each meal with fruits and vegetables. °¨ Filling one quarter of your plate at each meal with whole grains. Whole grains include whole wheat pasta, brown rice, and whole grain bread. °¨ Eating or drinking low-fat dairy products, such as skim milk or low-fat yogurt. °¨ Filling one quarter of your plate at each meal with low-fat (lean) proteins. Low-fat proteins include fish, skinless chicken, eggs, beans, and tofu. °¨ Avoiding fatty meat, cured and processed meat, or chicken with skin. °¨ Avoiding premade or processed food. °· Eat less than 1,500 mg of salt (sodium) a day. °· Limit alcohol use to no more than 1 drink a day for nonpregnant women and 2 drinks a day for men. One drink equals 12 oz of beer, 5 oz of wine, or 1½ oz of hard liquor. °Lifestyle  °· Work with your doctor to stay at a healthy weight or to lose weight. Ask your doctor what the best weight is for you. °· Get at least 30 minutes of exercise that causes your heart to beat faster (aerobic exercise) most days of the week. This may include walking, swimming, or biking. °· Get at least 30 minutes of exercise that strengthens your muscles (resistance exercise) at least 3 days a week. This may include lifting weights or pilates. °· Do not use any  products that contain nicotine or tobacco. This includes cigarettes and e-cigarettes. If you need help quitting, ask your doctor. °· Check your blood pressure at home as told by your doctor. °· Keep all follow-up visits as told by your doctor. This is important. °Medicines  °· Take over-the-counter and prescription medicines only as told by your doctor. Follow directions carefully. °· Do not skip doses of blood pressure medicine. The medicine does not work as well if you skip doses. Skipping doses also puts you at risk for problems. °· Ask your doctor about side effects or reactions to medicines that you should watch for. °Contact a doctor if: °· You think you are having a reaction to the medicine you are taking. °· You have headaches that keep coming back (recurring). °· You feel dizzy. °· You have swelling in your ankles. °· You have trouble with your vision. °Get help right away if: °· You get a very bad headache. °· You start to feel confused. °· You feel weak or numb. °· You feel faint. °· You get very bad pain in your: °¨ Chest. °¨ Belly (abdomen). °· You throw up (vomit) more than once. °· You have trouble breathing. °Summary °· Hypertension is another name for high blood pressure. °· Making healthy choices can help lower blood pressure. If your blood pressure cannot be controlled with healthy choices, you may need to take medicine. °This information is not intended to replace advice given to you by your   health care provider. Make sure you discuss any questions you have with your health care provider. °Document Released: 11/06/2007 Document Revised: 04/17/2016 Document Reviewed: 04/17/2016 °Elsevier Interactive Patient Education © 2017 [employer] Inc. ° °

## 2016-10-29 NOTE — Assessment & Plan Note (Signed)
Diastolic too high Restart Amlodipine 10 mg daily, refilled today  Make an appt for your annual exam 06/2017

## 2016-10-29 NOTE — Progress Notes (Signed)
Subjective:    Patient ID: [name], female    DOB: 1976-02-01, 41 y.o.   MRN: [id number]  HPI  Pt presents to the clinic today for follow up of HTN. At her last visit, she had been out of her meds x 3 days. We continued to hold the Lisinopril and Amlodipine, to see if her blood pressure was controlled without it. Her BP today is 126/90.  Review of Systems  Past Medical History:  Diagnosis Date  . Depression   . Hypertension     Current Outpatient Prescriptions  Medication Sig Dispense Refill  . amLODipine (NORVASC) 10 MG tablet TAKE 1 TABLET BY MOUTH ONE TIME DAILY 30 tablet 1  . fluticasone (FLONASE) 50 MCG/ACT nasal spray SPRAY TWICE INTO EACH NOSTRIL ONCE DAILY 16 g 3  . lisinopril (PRINIVIL,ZESTRIL) 10 MG tablet TAKE 1 TABLET BY MOUTH ONE TIME DAILY. 30 tablet 1  . naproxen sodium (ANAPROX) 220 MG tablet Take 220 mg by mouth 2 (two) times daily with a meal. Reported on 06/22/2015     No current facility-administered medications for this visit.     Allergies  Allergen Reactions  . Epinephrine Other (See Comments)    Severe migraine the next day  . Morphine     REACTION: itching    Family History  Problem Relation Age of Onset  . Hypertension Mother   . Diabetes Mother   . Stroke Mother 49       x2  . Hypertension Father   . Diabetes Unknown        grandmother  . Colon cancer Paternal Grandfather        dx after age 29    Social History   Social History  . Marital status: Married    Spouse name: N/A  . Number of children: N/A  . Years of education: N/A   Occupational History  . Not on file.   Social History Main Topics  . Smoking status: Current Every Day Smoker    Packs/day: 0.50    Years: 20.00    Last attempt to quit: 04/19/2015  . Smokeless tobacco: Never Used     Comment: 6 cigs per day  . Alcohol use 0.0 oz/week     Comment: occ.  . Drug use: No  . Sexual activity: Yes   Other Topics Concern  . Not on file   Social History  Narrative   Sole income for household of 4, husband lost his job.    Husband previously had affair.     Constitutional: Denies fever, malaise, fatigue, headache or abrupt weight changes.  Respiratory: Denies difficulty breathing, shortness of breath, cough or sputum production.   Cardiovascular: Denies chest pain, chest tightness, palpitations or swelling in the hands or feet.  Neurological: Denies dizziness, difficulty with memory, difficulty with speech or problems with balance and coordination.    No other specific complaints in a complete review of systems (except as listed in HPI above).     Objective:   Physical Exam   BP 126/90   Pulse 64   Temp 98.4 F (36.9 C) (Oral)   Wt 145 lb (65.8 kg)   SpO2 98%   BMI 27.40 kg/m  Wt Readings from Last 3 Encounters:  10/29/16 145 lb (65.8 kg)  10/14/16 139 lb 12 oz (63.4 kg)  12/21/15 141 lb 4 oz (64.1 kg)    General: Appears her stated age, well developed, well nourished in NAD. Cardiovascular: Normal rate and rhythm.  S1,S2 noted.  No murmur, rubs or gallops noted.  Pulmonary/Chest: Normal effort and positive vesicular breath sounds. No respiratory distress. No wheezes, rales or ronchi noted.   Neurological: Alert and oriented.  BMET    Component Value Date/Time   NA 140 12/21/2015 1457   K 4.0 12/21/2015 1457   CL 105 12/21/2015 1457   CO2 27 12/21/2015 1457   GLUCOSE 114 (H) 12/21/2015 1457   GLUCOSE 77 09/13/2008   BUN 14 12/21/2015 1457   CREATININE 0.93 12/21/2015 1457   CALCIUM 9.3 12/21/2015 1457   GFRNONAA 87.98 09/21/2009 0907   GFRAA  06/09/2008 1020    >60        The eGFR has been calculated using the MDRD equation. This calculation has not been validated in all clinical situations. eGFR's persistently <60 mL/min signify possible Chronic Kidney Disease.    Lipid Panel     Component Value Date/Time   CHOL 167 06/22/2015 1552   TRIG 45.0 06/22/2015 1552   HDL 58.50 06/22/2015 1552   CHOLHDL 3  06/22/2015 1552   VLDL 9.0 06/22/2015 1552   LDLCALC 100 (H) 06/22/2015 1552    CBC    Component Value Date/Time   WBC 8.1 12/21/2015 1457   RBC 4.41 12/21/2015 1457   HGB 12.9 12/21/2015 1457   HCT 38.6 12/21/2015 1457   PLT 234.0 12/21/2015 1457   PLT 220 09/13/2008   MCV 87.5 12/21/2015 1457   MCH 34.3 09/13/2008   MCHC 33.4 12/21/2015 1457   RDW 13.5 12/21/2015 1457   LYMPHSABS 2.4 12/21/2015 1457   MONOABS 0.4 12/21/2015 1457   EOSABS 0.2 12/21/2015 1457   BASOSABS 0.0 12/21/2015 1457    Hgb A1C No results found for: HGBA1C         Assessment & Plan:

## 2017-04-04 ENCOUNTER — Other Ambulatory Visit: Payer: Self-pay

## 2017-04-04 MED ORDER — AMLODIPINE BESYLATE 10 MG PO TABS: 10.0000 mg | ORAL_TABLET | Freq: Every day | ORAL | 0 refills | Status: DC

## 2017-10-25 ENCOUNTER — Other Ambulatory Visit: Payer: Self-pay | Admitting: Internal Medicine

## 2017-11-22 ENCOUNTER — Other Ambulatory Visit: Payer: Self-pay | Admitting: Internal Medicine

## 2017-12-20 ENCOUNTER — Other Ambulatory Visit: Payer: Self-pay | Admitting: Internal Medicine

## 2018-06-01 ENCOUNTER — Telehealth: Payer: Self-pay | Admitting: Internal Medicine

## 2018-06-01 NOTE — Telephone Encounter (Signed)
Left message on voicemail offering 3:45pm appt today also stating she needs to be evaluated due to extreme health risks with BP being that high

## 2018-06-01 NOTE — Telephone Encounter (Signed)
Pt called to set up appt for CPE. She said she went for an eye exam and was told her BP was 220/200. She insisted on a CPE b/c her insurance is expensive and she cannot afford an OV. She was last seen 10/2016. I had her hold for triage nurse and while holding I offered her a 345 with her PCP, Webb Silversmith, but she said she could not afford to come. I told her we can bill it and she can pay as she can but she was not interested. She said she would not hold any longer to speak to a nurse and hung up.

## 2018-06-01 NOTE — Telephone Encounter (Signed)
Noted. This BP is dangerous, could lead to heart attack or stroke. She needs to be evaluated, if not here that at [facility] or ER.
